# Patient Record
Sex: Female | Born: 1995 | Race: Black or African American | Hispanic: No | Marital: Single | Smoking: Current every day smoker
Health system: Southern US, Community
[De-identification: ages and names within clinical notes are randomized; demographics above are authoritative.]

## PROBLEM LIST (undated history)

## (undated) ENCOUNTER — Inpatient Hospital Stay (HOSPITAL_COMMUNITY): Payer: Self-pay

## (undated) DIAGNOSIS — Z789 Other specified health status: Secondary | ICD-10-CM

## (undated) HISTORY — PX: NO PAST SURGERIES: SHX2092

---
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
## 2016-01-01 ENCOUNTER — Encounter (HOSPITAL_COMMUNITY): Payer: Self-pay

## 2016-01-01 ENCOUNTER — Inpatient Hospital Stay (HOSPITAL_COMMUNITY)
Admission: AD | Admit: 2016-01-01 | Discharge: 2016-01-01 | Disposition: A | Discharge status: Home / Self Care | Payer: Medicaid Other | Source: Ambulatory Visit | Attending: Family Medicine | Admitting: Family Medicine

## 2016-01-01 DIAGNOSIS — Z825 Family history of asthma and other chronic lower respiratory diseases: Secondary | ICD-10-CM | POA: Diagnosis not present

## 2016-01-01 DIAGNOSIS — O30039 Twin pregnancy, monochorionic/diamniotic, unspecified trimester: Secondary | ICD-10-CM

## 2016-01-01 DIAGNOSIS — R112 Nausea with vomiting, unspecified: Secondary | ICD-10-CM | POA: Insufficient documentation

## 2016-01-01 DIAGNOSIS — O4693 Antepartum hemorrhage, unspecified, third trimester: Secondary | ICD-10-CM | POA: Diagnosis not present

## 2016-01-01 DIAGNOSIS — O30033 Twin pregnancy, monochorionic/diamniotic, third trimester: Secondary | ICD-10-CM | POA: Diagnosis not present

## 2016-01-01 DIAGNOSIS — Z833 Family history of diabetes mellitus: Secondary | ICD-10-CM | POA: Diagnosis not present

## 2016-01-01 DIAGNOSIS — O99013 Anemia complicating pregnancy, third trimester: Secondary | ICD-10-CM | POA: Diagnosis not present

## 2016-01-01 DIAGNOSIS — O9989 Other specified diseases and conditions complicating pregnancy, childbirth and the puerperium: Secondary | ICD-10-CM | POA: Insufficient documentation

## 2016-01-01 DIAGNOSIS — R51 Headache: Secondary | ICD-10-CM | POA: Insufficient documentation

## 2016-01-01 DIAGNOSIS — O219 Vomiting of pregnancy, unspecified: Secondary | ICD-10-CM | POA: Diagnosis not present

## 2016-01-01 DIAGNOSIS — Z3A34 34 weeks gestation of pregnancy: Secondary | ICD-10-CM | POA: Insufficient documentation

## 2016-01-01 DIAGNOSIS — R040 Epistaxis: Secondary | ICD-10-CM | POA: Insufficient documentation

## 2016-01-01 DIAGNOSIS — O4703 False labor before 37 completed weeks of gestation, third trimester: Secondary | ICD-10-CM

## 2016-01-01 DIAGNOSIS — N858 Other specified noninflammatory disorders of uterus: Secondary | ICD-10-CM | POA: Diagnosis present

## 2016-01-01 DIAGNOSIS — O4690 Antepartum hemorrhage, unspecified, unspecified trimester: Secondary | ICD-10-CM

## 2016-01-01 DIAGNOSIS — O479 False labor, unspecified: Secondary | ICD-10-CM

## 2016-01-01 LAB — COMPREHENSIVE METABOLIC PANEL
ALK PHOS: 240 U/L — AB (ref 38–126)
ALT: 15 U/L (ref 14–54)
AST: 27 U/L (ref 15–41)
Albumin: 2.6 g/dL — ABNORMAL LOW (ref 3.5–5.0)
Anion gap: 8 (ref 5–15)
BILIRUBIN TOTAL: 0.9 mg/dL (ref 0.3–1.2)
BUN: 7 mg/dL (ref 6–20)
CALCIUM: 8.8 mg/dL — AB (ref 8.9–10.3)
CO2: 21 mmol/L — AB (ref 22–32)
CREATININE: 0.53 mg/dL (ref 0.44–1.00)
Chloride: 106 mmol/L (ref 101–111)
Glucose, Bld: 96 mg/dL (ref 65–99)
Potassium: 3.6 mmol/L (ref 3.5–5.1)
Sodium: 135 mmol/L (ref 135–145)
TOTAL PROTEIN: 6.4 g/dL — AB (ref 6.5–8.1)

## 2016-01-01 LAB — CBC WITH DIFFERENTIAL/PLATELET
BASOS ABS: 0 10*3/uL (ref 0.0–0.1)
BASOS PCT: 0 %
Eosinophils Absolute: 0.1 10*3/uL (ref 0.0–0.7)
Eosinophils Relative: 1 %
HEMATOCRIT: 25 % — AB (ref 36.0–46.0)
HEMOGLOBIN: 8.1 g/dL — AB (ref 12.0–15.0)
LYMPHS PCT: 22 %
Lymphs Abs: 2 10*3/uL (ref 0.7–4.0)
MCH: 26.5 pg (ref 26.0–34.0)
MCHC: 32.4 g/dL (ref 30.0–36.0)
MCV: 81.7 fL (ref 78.0–100.0)
Monocytes Absolute: 0.8 10*3/uL (ref 0.1–1.0)
Monocytes Relative: 8 %
NEUTROS ABS: 6.2 10*3/uL (ref 1.7–7.7)
NEUTROS PCT: 69 %
Platelets: 246 10*3/uL (ref 150–400)
RBC: 3.06 MIL/uL — ABNORMAL LOW (ref 3.87–5.11)
RDW: 13.8 % (ref 11.5–15.5)
WBC: 9.1 10*3/uL (ref 4.0–10.5)

## 2016-01-01 LAB — PROTEIN / CREATININE RATIO, URINE
Creatinine, Urine: 94 mg/dL
PROTEIN CREATININE RATIO: 0.29 mg/mg{creat} — AB (ref 0.00–0.15)
TOTAL PROTEIN, URINE: 27 mg/dL

## 2016-01-01 LAB — URINE MICROSCOPIC-ADD ON: RBC / HPF: NONE SEEN RBC/hpf (ref 0–5)

## 2016-01-01 LAB — URINALYSIS, ROUTINE W REFLEX MICROSCOPIC
BILIRUBIN URINE: NEGATIVE
Glucose, UA: NEGATIVE mg/dL
KETONES UR: 15 mg/dL — AB
NITRITE: NEGATIVE
PH: 6.5 (ref 5.0–8.0)
PROTEIN: NEGATIVE mg/dL
Specific Gravity, Urine: <1.005 — ABNORMAL LOW (ref 1.005–1.030)

## 2016-01-01 MED ORDER — PROMETHAZINE HCL 25 MG PO TABS: 25.0000 mg | ORAL_TABLET | Freq: Four times a day (QID) | ORAL | Status: DC | PRN

## 2016-01-01 NOTE — Discharge Instructions (Signed)
Braxton Hicks Contractions Contractions of the uterus can occur throughout pregnancy. Contractions are not always a sign that you are in labor.  WHAT ARE BRAXTON HICKS CONTRACTIONS?  Contractions that occur before labor are called Braxton Hicks contractions, or false labor. Toward the end of pregnancy (32-34 weeks), these contractions can develop more often and may become more forceful. This is not true labor because these contractions do not result in opening (dilatation) and thinning of the cervix. They are sometimes difficult to tell apart from true labor because these contractions can be forceful and people have different pain tolerances. You should not feel embarrassed if you go to the hospital with false labor. Sometimes, the only way to tell if you are in true labor is for your health care provider to look for changes in the cervix. If there are no prenatal problems or other health problems associated with the pregnancy, it is completely safe to be sent home with false labor and await the onset of true labor. HOW CAN YOU TELL THE DIFFERENCE BETWEEN TRUE AND FALSE LABOR? False Labor  The contractions of false labor are usually shorter and not as hard as those of true labor.   The contractions are usually irregular.   The contractions are often felt in the front of the lower abdomen and in the groin.   The contractions may go away when you walk around or change positions while lying down.   The contractions get weaker and are shorter lasting as time goes on.   The contractions do not usually become progressively stronger, regular, and closer together as with true labor.  True Labor  Contractions in true labor last 30-70 seconds, become very regular, usually become more intense, and increase in frequency.   The contractions do not go away with walking.   The discomfort is usually felt in the top of the uterus and spreads to the lower abdomen and low back.   True labor can be  determined by your health care provider with an exam. This will show that the cervix is dilating and getting thinner.  WHAT TO REMEMBER  Keep up with your usual exercises and follow other instructions given by your health care provider.   Take medicines as directed by your health care provider.   Keep your regular prenatal appointments.   Eat and drink lightly if you think you are going into labor.   If Braxton Hicks contractions are making you uncomfortable:   Change your position from lying down or resting to walking, or from walking to resting.   Sit and rest in a tub of warm water.   Drink 2-3 glasses of water. Dehydration may cause these contractions.   Do slow and deep breathing several times an hour.  WHEN SHOULD I SEEK IMMEDIATE MEDICAL CARE? Seek immediate medical care if:  Your contractions become stronger, more regular, and closer together.   You have fluid leaking or gushing from your vagina.   You have a fever.   You pass blood-tinged mucus.   You have vaginal bleeding.   You have continuous abdominal pain.   You have low back pain that you never had before.   You feel your baby's head pushing down and causing pelvic pressure.   Your baby is not moving as much as it used to.    This information is not intended to replace advice given to you by your health care provider. Make sure you discuss any questions you have with your health care  provider.   Document Released: 10/28/2005 Document Revised: 11/02/2013 Document Reviewed: 08/09/2013 Elsevier Interactive Patient Education 2016 Elsevier Inc. Morning Sickness Morning sickness is when you feel sick to your stomach (nauseous) during pregnancy. This nauseous feeling may or may not come with vomiting. It often occurs in the morning but can be a problem any time of day. Morning sickness is most common during the first trimester, but it may continue throughout pregnancy. While morning sickness  is unpleasant, it is usually harmless unless you develop severe and continual vomiting (hyperemesis gravidarum). This condition requires more intense treatment.  CAUSES  The cause of morning sickness is not completely known but seems to be related to normal hormonal changes that occur in pregnancy. RISK FACTORS You are at greater risk if you:  Experienced nausea or vomiting before your pregnancy.  Had morning sickness during a previous pregnancy.  Are pregnant with more than one baby, such as twins. TREATMENT  Do not use any medicines (prescription, over-the-counter, or herbal) for morning sickness without first talking to your health care provider. Your health care provider may prescribe or recommend:  Vitamin B6 supplements.  Anti-nausea medicines.  The herbal medicine ginger. HOME CARE INSTRUCTIONS   Only take over-the-counter or prescription medicines as directed by your health care provider.  Taking multivitamins before getting pregnant can prevent or decrease the severity of morning sickness in most women.  Eat a piece of dry toast or unsalted crackers before getting out of bed in the morning.  Eat five or six small meals a day.  Eat dry and bland foods (rice, baked potato). Foods high in carbohydrates are often helpful.  Do not drink liquids with your meals. Drink liquids between meals.  Avoid greasy, fatty, and spicy foods.  Get someone to cook for you if the smell of any food causes nausea and vomiting.  If you feel nauseous after taking prenatal vitamins, take the vitamins at night or with a snack.  Snack on protein foods (nuts, yogurt, cheese) between meals if you are hungry.  Eat unsweetened gelatins for desserts.  Wearing an acupressure wristband (worn for sea sickness) may be helpful.  Acupuncture may be helpful.  Do not smoke.  Get a humidifier to keep the air in your house free of odors.  Get plenty of fresh air. SEEK MEDICAL CARE IF:   Your home  remedies are not working, and you need medicine.  You feel dizzy or lightheaded.  You are losing weight. SEEK IMMEDIATE MEDICAL CARE IF:   You have persistent and uncontrolled nausea and vomiting.  You pass out (faint). MAKE SURE YOU:  Understand these instructions.  Will watch your condition.  Will get help right away if you are not doing well or get worse.   This information is not intended to replace advice given to you by your health care provider. Make sure you discuss any questions you have with your health care provider.   Document Released: 12/19/2006 Document Revised: 11/02/2013 Document Reviewed: 04/14/2013 Elsevier Interactive Patient Education [employer]

## 2016-01-01 NOTE — MAU Note (Signed)
After reviewing lab work, per Dr Ashok Pall pt may be discharged home. Pt is to have follow up this week in clinic if she does not already have an appointment scheduled.

## 2016-01-01 NOTE — MAU Provider Note (Signed)
MAU HISTORY AND PHYSICAL  Chief Complaint:  contractions  Pamela [name] is a 20 y.o.  G1P0 with IUP at [redacted]w[redacted]d, mo-di twin pregnancy, presenting for contractions. Began earlier today. Unsure how often but "frequent." No leakage of fluid or bleeding. Has had intermittent nausea/vomiting throughout pregnancy, least earlier this morning. No abdominal pain outside contractions. No fever. No dysuria or hematuria. No vaginal discharge. Positive fetal movement.  Also complaining of headache earlier today, now resolved, as well as epistaxis earlier today, also resolved. No bleeding elsewhere. Has had several nosebleeds this pregnancy.  Past Medical History  Diagnosis Date  . Medical history non-contributory     Past Surgical History  Procedure Laterality Date  . No past surgeries      Family History  Problem Relation Age of Onset  . Diabetes Mother   . Asthma Sister     Social History  Substance Use Topics  . Smoking status: Never Smoker   . Smokeless tobacco: None  . Alcohol Use: No    No Known Allergies  No prescriptions prior to admission    Review of Systems - Negative except for what is mentioned in HPI.  Physical Exam  Blood pressure 137/78, pulse 82, temperature 98.4 F (36.9 C), temperature source Oral, resp. rate 18, height  (1.6 m), weight 143 lb (64.864 kg), last menstrual period 05/01/2015, SpO2 100 %. GENERAL: Well-developed, well-nourished female in no acute distress.  LUNGS: Clear to auscultation bilaterally.  HEART: Regular rate and rhythm. ABDOMEN: Soft, nontender, nondistended, gravid.  EXTREMITIES: Nontender, no edema, 2+ distal pulses. Cervical Exam: closed/thick/high FHT:  140/mod/+a/-d 145/mod/+a/-d    Labs: Results for orders placed or performed during the hospital encounter of 01/01/16 (from the past 24 hour(s))  Urinalysis, Routine w reflex microscopic (not at [facility])   Collection Time: 01/01/16  9:04 PM  Result Value Ref Range   Color,  Urine YELLOW YELLOW   APPearance CLEAR CLEAR   Specific Gravity, Urine <1.005 (L) 1.005 - 1.030   pH 6.5 5.0 - 8.0   Glucose, UA NEGATIVE NEGATIVE mg/dL   Hgb urine dipstick TRACE (A) NEGATIVE   Bilirubin Urine NEGATIVE NEGATIVE   Ketones, ur 15 (A) NEGATIVE mg/dL   Protein, ur NEGATIVE NEGATIVE mg/dL   Nitrite NEGATIVE NEGATIVE   Leukocytes, UA SMALL (A) NEGATIVE  Urine microscopic-add on   Collection Time: 01/01/16  9:04 PM  Result Value Ref Range   Squamous Epithelial / LPF 0-5 (A) NONE SEEN   WBC, UA 0-5 0 - 5 WBC/hpf   RBC / HPF NONE SEEN 0 - 5 RBC/hpf   Bacteria, UA FEW (A) NONE SEEN  Protein / creatinine ratio, urine   Collection Time: 01/01/16  9:04 PM  Result Value Ref Range   Creatinine, Urine 94.00 mg/dL   Total Protein, Urine 27 mg/dL   Protein Creatinine Ratio 0.29 (H) 0.00 - 0.15 mg/mg[Cre]  CBC with Differential/Platelet   Collection Time: 01/01/16 10:12 PM  Result Value Ref Range   WBC 9.1 4.0 - 10.5 K/uL   RBC 3.06 (L) 3.87 - 5.11 MIL/uL   Hemoglobin 8.1 (L) 12.0 - 15.0 g/dL   HCT 30.8 (L) 65.7 - 84.6 %   MCV 81.7 78.0 - 100.0 fL   MCH 26.5 26.0 - 34.0 pg   MCHC 32.4 30.0 - 36.0 g/dL   RDW 96.2 95.2 - 84.1 %   Platelets 246 150 - 400 K/uL   Neutrophils Relative % 69 %   Neutro Abs 6.2 1.7 -  7.7 K/uL   Lymphocytes Relative 22 %   Lymphs Abs 2.0 0.7 - 4.0 K/uL   Monocytes Relative 8 %   Monocytes Absolute 0.8 0.1 - 1.0 K/uL   Eosinophils Relative 1 %   Eosinophils Absolute 0.1 0.0 - 0.7 K/uL   Basophils Relative 0 %   Basophils Absolute 0.0 0.0 - 0.1 K/uL  Comprehensive metabolic panel   Collection Time: 01/01/16 10:12 PM  Result Value Ref Range   Sodium 135 135 - 145 mmol/L   Potassium 3.6 3.5 - 5.1 mmol/L   Chloride 106 101 - 111 mmol/L   CO2 21 (L) 22 - 32 mmol/L   Glucose, Bld 96 65 - 99 mg/dL   BUN 7 6 - 20 mg/dL   Creatinine, Ser 1.61 0.44 - 1.00 mg/dL   Calcium 8.8 (L) 8.9 - 10.3 mg/dL   Total Protein 6.4 (L) 6.5 - 8.1 g/dL   Albumin 2.6  (L) 3.5 - 5.0 g/dL   AST 27 15 - 41 U/L   ALT 15 14 - 54 U/L   Alkaline Phosphatase 240 (H) 38 - 126 U/L   Total Bilirubin 0.9 0.3 - 1.2 mg/dL   GFR calc non Af Amer >60 >60 mL/min   GFR calc Af Amer >60 >60 mL/min   Anion gap 8 5 - 15    Imaging Studies:  [place] Mfm Fetal Bpp Wo Non Stress  12/18/2015  OBSTETRICAL ULTRASOUND: This exam was performed within a [place] [facility] Department. The OB US report was generated in the AS system, and faxed to the ordering physician.  This report is available in the [employer]. See the AS Obstetric US report via the Image Link.  [place] Mfm Ob Follow Up  12/18/2015  OBSTETRICAL ULTRASOUND: This exam was performed within a [place] [facility] Department. The OB US report was generated in the AS system, and faxed to the ordering physician.  This report is available in the [employer]. See the AS Obstetric US report via the Image Link.  [place] Mfm Ob Follow Up Addl Gest  12/18/2015  OBSTETRICAL ULTRASOUND: This exam was performed within a [place] [facility] Department. The OB US report was generated in the AS system, and faxed to the ordering physician.  This report is available in the [employer]. See the AS Obstetric US report via the Image Link.  [place] Mfm Ob Limited  12/13/2015  OBSTETRICAL ULTRASOUND: This exam was performed within a [place] [facility] Department. The OB US report was generated in the AS system, and faxed to the ordering physician.  This report is available in the [employer]. See the AS Obstetric US report via the Image Link.  [place] Mfm Ob Limited  12/07/2015  OBSTETRICAL ULTRASOUND: This exam was performed within a [place] [facility] Department. The OB US report was generated in the AS system, and faxed to the ordering physician.  This report is available in the [employer]. See the AS Obstetric US report via the Image Link.  [place] Mfm Fetal Bpp Wo Nst Addl Gestation  12/18/2015  OBSTETRICAL ULTRASOUND: This exam was performed within a  [place] [facility]trasound Department. The OB US report was generated in the AS system, and faxed to the ordering physician.  This report is available in the [employer]. See the AS Obstetric US report via the Image Link.   Assessment/Plan: Pamela [name] is  20 y.o. G1P0 at [redacted]w[redacted]d presents with various complaints.  #Epistaxis - no current bleeding - reassured  this normal during pregnancy - vaseline daily to both nares  #Braxton hicks contractions - NST reactive - cervix closed/thick/high - some degree of dehydration from n/v may contribute - ptl, abruption, and pprom return precautions  #Nausea/vomiting - has suffered from this throughout pregnancy. Says does not have prn meds at home.  - here well-appearing, does not appear dehydrated. No abdominal pain to suggest intraabdominal pathology - phenergan script - abdominal pain and dehhydration return precautions; push fluids  #Headache - initial BP mildly elevated, remainder all wnl. HA mostly resolved, no other preE symptoms - preE labs obtained, showing UPC of 0.29 and remainder wnl.  - consider repeat UPC vs 24-hour collection at w/u  #Anemia - this noted after patient's discharge - ferritin added-on to confirm iron deficiency - start oral iron - will have nursing contact patient for clinic visit this week to consider [facility] 2/21/201712:10 AM

## 2016-01-01 NOTE — MAU Note (Signed)
Pt presents with complaint of contractions all day, worsening and getting closer. Has had nosebleeds all day. Also reports she may have had a fever earlier today. Also reports a headache and vomiting today.

## 2016-01-02 LAB — FERRITIN: Ferritin: 11 ng/mL (ref 11–307)

## 2016-01-02 MED ORDER — FERROUS SULFATE 325 (65 FE) MG PO TABS: 325.0000 mg | ORAL_TABLET | Freq: Two times a day (BID) | ORAL | Status: AC

## 2016-01-03 LAB — CULTURE, OB URINE

## 2016-01-04 ENCOUNTER — Ambulatory Visit (INDEPENDENT_AMBULATORY_CARE_PROVIDER_SITE_OTHER): Payer: Medicaid Other | Admitting: Family

## 2016-01-04 VITALS — BP 140/85 | HR 88 | Temp 98.4°F | Wt 147.4 lb

## 2016-01-04 DIAGNOSIS — O163 Unspecified maternal hypertension, third trimester: Secondary | ICD-10-CM

## 2016-01-04 DIAGNOSIS — O99013 Anemia complicating pregnancy, third trimester: Secondary | ICD-10-CM | POA: Diagnosis not present

## 2016-01-04 DIAGNOSIS — O1493 Unspecified pre-eclampsia, third trimester: Secondary | ICD-10-CM

## 2016-01-04 DIAGNOSIS — O133 Gestational [pregnancy-induced] hypertension without significant proteinuria, third trimester: Secondary | ICD-10-CM | POA: Diagnosis present

## 2016-01-04 DIAGNOSIS — D649 Anemia, unspecified: Secondary | ICD-10-CM

## 2016-01-04 DIAGNOSIS — O30039 Twin pregnancy, monochorionic/diamniotic, unspecified trimester: Secondary | ICD-10-CM

## 2016-01-04 DIAGNOSIS — O30033 Twin pregnancy, monochorionic/diamniotic, third trimester: Secondary | ICD-10-CM | POA: Diagnosis not present

## 2016-01-04 DIAGNOSIS — O165 Unspecified maternal hypertension, complicating the puerperium: Secondary | ICD-10-CM | POA: Insufficient documentation

## 2016-01-04 DIAGNOSIS — O0993 Supervision of high risk pregnancy, unspecified, third trimester: Secondary | ICD-10-CM

## 2016-01-04 LAB — CBC
HCT: 24.6 % — ABNORMAL LOW (ref 36.0–46.0)
Hemoglobin: 8 g/dL — ABNORMAL LOW (ref 12.0–15.0)
MCH: 26.1 pg (ref 26.0–34.0)
MCHC: 32.5 g/dL (ref 30.0–36.0)
MCV: 80.4 fL (ref 78.0–100.0)
MPV: 9.8 fL (ref 8.6–12.4)
PLATELETS: 236 10*3/uL (ref 150–400)
RBC: 3.06 MIL/uL — ABNORMAL LOW (ref 3.87–5.11)
RDW: 14.1 % (ref 11.5–15.5)
WBC: 6.4 10*3/uL (ref 4.0–10.5)

## 2016-01-04 LAB — COMPREHENSIVE METABOLIC PANEL
ALBUMIN: 2.9 g/dL — AB (ref 3.6–5.1)
ALK PHOS: 242 U/L — AB (ref 47–176)
ALT: 12 U/L (ref 5–32)
AST: 15 U/L (ref 12–32)
BILIRUBIN TOTAL: 0.5 mg/dL (ref 0.2–1.1)
BUN: 7 mg/dL (ref 7–20)
CO2: 21 mmol/L (ref 20–31)
CREATININE: 0.55 mg/dL (ref 0.50–1.00)
Calcium: 8.8 mg/dL — ABNORMAL LOW (ref 8.9–10.4)
Chloride: 105 mmol/L (ref 98–110)
GLUCOSE: 93 mg/dL (ref 65–99)
Potassium: 4.2 mmol/L (ref 3.8–5.1)
SODIUM: 136 mmol/L (ref 135–146)
Total Protein: 5.7 g/dL — ABNORMAL LOW (ref 6.3–8.2)

## 2016-01-04 LAB — POCT URINALYSIS DIP (DEVICE)
Bilirubin Urine: NEGATIVE
GLUCOSE, UA: NEGATIVE mg/dL
Hgb urine dipstick: NEGATIVE
KETONES UR: NEGATIVE mg/dL
Nitrite: NEGATIVE
Protein, ur: NEGATIVE mg/dL
Specific Gravity, Urine: 1.015 (ref 1.005–1.030)
Urobilinogen, UA: 0.2 mg/dL (ref 0.0–1.0)
pH: 6.5 (ref 5.0–8.0)

## 2016-01-04 NOTE — Progress Notes (Signed)
Subjective:  Pamela [name] is a 20 y.o. G1P0 at 73w5dbeing seen today for ongoing prenatal care.  She is currently monitored for the following issues for this high-risk pregnancy and has Monochorionic diamniotic twin pregnancy, antepartum; Susceptible to varicella (non-immune), currently pregnant; High-risk pregnancy supervision; Vaginal bleeding during pregnancy, antepartum; Pica; and Elevated blood pressure complicating pregnancy, antepartum on her problem list.  Patient reports being seen in [facility]U on 2/20 for contractions.  At visit reports blood pressure was elevated, however can not find it documented.  Denies headach, vision changes, or epigastric pain today.  Contractions: Irregular. Vag. Bleeding: None.  Movement: Present. Denies leaking of fluid.   The following portions of the patient's history were reviewed and updated as appropriate: allergies, current medications, past family history, past medical history, past social history, past surgical history and problem list. Problem list updated.  Objective:   Filed Vitals:   01/04/16 0932  BP: 140/85  Pulse: 88  Temp: 98.4 F (36.9 C)  Weight: 147 lb 6.4 oz (66.86 kg)    Fetal Status: Fetal Heart Rate (bpm): 131/159 Fundal Height: 39 cm Movement: Present     General:  Alert, oriented and cooperative. Patient is in no acute distress.  Skin: Skin is warm and dry. No rash noted.   Cardiovascular: Normal heart rate noted  Respiratory: Normal respiratory effort, no problems with respiration noted  Abdomen: Soft, gravid, appropriate for gestational age. Pain/Pressure: Present     Pelvic: Vag. Bleeding: None     Cervical exam deferred        Extremities: Normal range of motion.  Edema: None  Mental Status: Normal mood and affect. Normal behavior. Normal judgment and thought content.   Urinalysis: Urine Protein: Negative Urine Glucose: Negative  Assessment and Plan:  Pregnancy: G1P0 at 366w5d1. High-risk pregnancy supervision,  third trimester - Continue close observation  2. Monochorionic diamniotic twin pregnancy, antepartum - NST today; begin 2x/week fetal testing - Next growth ultrasound on 01/08/16  3. Elevated blood pressure complicating pregnancy, antepartum, third trimester - Protein / creatinine ratio, urine - Comp Met (CMET) - CBC  Preterm labor symptoms and general obstetric precautions including but not limited to vaginal bleeding, contractions, leaking of fluid and fetal movement were reviewed in detail with the patient. Please refer to After Visit Summary for other counseling recommendations.  Return for NST 2/wk and appt weekly.   WaVenia Carbon Michiel CowboyCNM

## 2016-01-04 NOTE — Patient Instructions (Signed)
AREA PEDIATRIC/FAMILY PRACTICE PHYSICIANS  [facility] [address] [place], [place] [address] Phone - [phone]   Fax - [phone]  [facility] [address] [place], [place]  [address] Phone - [phone]   Fax - [phone]  [facility] [address] [place], [place]  [address] Phone - [phone]   Fax - [phone]  [facility] [address] [place], [place]  [address] Phone - [phone]   Fax - [phone]  [facility] [address], [address] [place], [place]  [address] Phone - [phone]   Fax - [phone]  [facility] [address] [place], [place]  [address] Phone - [phone]   Fax - [phone]  [facility] [address] [place], [place]  [address] Phone - [phone]   Fax - [phone]  [facility] [address], [address] [place], [place]  [address] Phone - [phone]   Fax - [phone]  [facility] [address] [place], [place]  [address] Phone - [phone]   Fax - [phone] [facility] [address] [place], [place]  [address] Phone - [phone]   Fax - [phone]  [facility] [address] [place], [place]  [address] Phone - [phone]   Fax - [phone]  [facility] [address] [place], [place]  [address] Phone - [phone]   Fax - [phone]  [facility] [address] [place], [place]  [address] Phone - [phone]   Fax - [phone]  SHILPA GOSRANI [address] [place], [place]  [address] Phone - [phone]  [facility] [address] [place], [place]  [address] Phone - [phone]   Fax - [phone]  [facility] [address] [place], [place]  [address] Phone - [phone]   Fax - [phone]  [facility] [address] [place], [place]  [address] Phone - [phone]   Fax - [phone]  [facility] [address] [place], [place]  [address] Phone - [phone]   Fax - [phone]   [facility] [address] [place], [place]  [address] Phone - [phone]   Fax - [phone]  [facility] [address] [place], [place]  [address] Phone - [phone]   Fax - [phone]  DAV[facility] RUB[facility] [address] [place], [place]  [address] Phone - [phone]   Fax - [phone]  [facility] [address] [place], [place]  [address] Phone - [phone]   Fax - [phone]  [facility] [address] [place], [place]  [address] Phone - [phone]   Fax - [phone] [facility] [address] [place], [place]  [address] Phone - [phone]   Fax - [phone]  [facility] [address] [place], [place]  [address] Phone - [phone]   Fax - [phone]  [facility] [address] [place],   [address] Phone - [phone]   Fax - [phone]   

## 2016-01-04 NOTE — Addendum Note (Signed)
Addended by: Jill Side on: 01/04/2016 11:42 AM   Modules accepted: Orders

## 2016-01-05 DIAGNOSIS — O99019 Anemia complicating pregnancy, unspecified trimester: Secondary | ICD-10-CM | POA: Insufficient documentation

## 2016-01-05 LAB — PROTEIN / CREATININE RATIO, URINE
Creatinine, Urine: 53 mg/dL (ref 20–320)
Protein Creatinine Ratio: 302 mg/g creat — ABNORMAL HIGH (ref 21–161)
Total Protein, Urine: 16 mg/dL (ref 5–24)

## 2016-01-05 NOTE — Progress Notes (Signed)
01/05/16  Results from prex labs returned.  PR:CR

## 2016-01-08 ENCOUNTER — Encounter (HOSPITAL_COMMUNITY): Payer: Self-pay

## 2016-01-08 ENCOUNTER — Ambulatory Visit (HOSPITAL_COMMUNITY)
Admission: RE | Admit: 2016-01-08 | Discharge: 2016-01-08 | Disposition: A | Discharge status: Home / Self Care | Payer: Medicaid Other | Source: Ambulatory Visit | Attending: Maternal and Fetal Medicine | Admitting: Maternal and Fetal Medicine

## 2016-01-08 DIAGNOSIS — O30033 Twin pregnancy, monochorionic/diamniotic, third trimester: Secondary | ICD-10-CM | POA: Diagnosis not present

## 2016-01-08 DIAGNOSIS — Z3A35 35 weeks gestation of pregnancy: Secondary | ICD-10-CM | POA: Diagnosis not present

## 2016-01-08 DIAGNOSIS — O133 Gestational [pregnancy-induced] hypertension without significant proteinuria, third trimester: Secondary | ICD-10-CM | POA: Insufficient documentation

## 2016-01-08 DIAGNOSIS — O30039 Twin pregnancy, monochorionic/diamniotic, unspecified trimester: Secondary | ICD-10-CM

## 2016-01-08 DIAGNOSIS — O163 Unspecified maternal hypertension, third trimester: Secondary | ICD-10-CM

## 2016-01-11 ENCOUNTER — Other Ambulatory Visit (HOSPITAL_COMMUNITY)
Admission: RE | Admit: 2016-01-11 | Discharge: 2016-01-11 | Disposition: A | Discharge status: Home / Self Care | Payer: Medicaid Other | Source: Ambulatory Visit | Attending: Obstetrics & Gynecology | Admitting: Obstetrics & Gynecology

## 2016-01-11 ENCOUNTER — Telehealth (HOSPITAL_COMMUNITY): Payer: Self-pay | Admitting: *Deleted

## 2016-01-11 ENCOUNTER — Ambulatory Visit (INDEPENDENT_AMBULATORY_CARE_PROVIDER_SITE_OTHER): Payer: Medicaid Other | Admitting: Obstetrics & Gynecology

## 2016-01-11 VITALS — BP 153/82 | HR 97 | Temp 98.5°F | Wt 152.9 lb

## 2016-01-11 DIAGNOSIS — O30033 Twin pregnancy, monochorionic/diamniotic, third trimester: Secondary | ICD-10-CM | POA: Diagnosis present

## 2016-01-11 DIAGNOSIS — O0993 Supervision of high risk pregnancy, unspecified, third trimester: Secondary | ICD-10-CM

## 2016-01-11 DIAGNOSIS — Z113 Encounter for screening for infections with a predominantly sexual mode of transmission: Secondary | ICD-10-CM | POA: Insufficient documentation

## 2016-01-11 DIAGNOSIS — O1493 Unspecified pre-eclampsia, third trimester: Secondary | ICD-10-CM

## 2016-01-11 DIAGNOSIS — O30039 Twin pregnancy, monochorionic/diamniotic, unspecified trimester: Secondary | ICD-10-CM

## 2016-01-11 LAB — COMPREHENSIVE METABOLIC PANEL
ALBUMIN: 2.8 g/dL — AB (ref 3.6–5.1)
ALT: 11 U/L (ref 5–32)
AST: 17 U/L (ref 12–32)
Alkaline Phosphatase: 264 U/L — ABNORMAL HIGH (ref 47–176)
BUN: 7 mg/dL (ref 7–20)
CALCIUM: 9 mg/dL (ref 8.9–10.4)
CHLORIDE: 106 mmol/L (ref 98–110)
CO2: 21 mmol/L (ref 20–31)
CREATININE: 0.5 mg/dL (ref 0.50–1.00)
Glucose, Bld: 79 mg/dL (ref 65–99)
POTASSIUM: 4.2 mmol/L (ref 3.8–5.1)
SODIUM: 134 mmol/L — AB (ref 135–146)
TOTAL PROTEIN: 5.7 g/dL — AB (ref 6.3–8.2)
Total Bilirubin: 0.6 mg/dL (ref 0.2–1.1)

## 2016-01-11 LAB — CBC
HEMATOCRIT: 23.7 % — AB (ref 36.0–46.0)
Hemoglobin: 7.6 g/dL — ABNORMAL LOW (ref 12.0–15.0)
MCH: 25.5 pg — ABNORMAL LOW (ref 26.0–34.0)
MCHC: 32.1 g/dL (ref 30.0–36.0)
MCV: 79.5 fL (ref 78.0–100.0)
MPV: 10.6 fL (ref 8.6–12.4)
Platelets: 259 10*3/uL (ref 150–400)
RBC: 2.98 MIL/uL — AB (ref 3.87–5.11)
RDW: 14.3 % (ref 11.5–15.5)
WBC: 6.2 10*3/uL (ref 4.0–10.5)

## 2016-01-11 LAB — POCT URINALYSIS DIP (DEVICE)
BILIRUBIN URINE: NEGATIVE
Glucose, UA: NEGATIVE mg/dL
KETONES UR: NEGATIVE mg/dL
Nitrite: NEGATIVE
PH: 6 (ref 5.0–8.0)
Protein, ur: 30 mg/dL — AB
SPECIFIC GRAVITY, URINE: 1.01 (ref 1.005–1.030)
Urobilinogen, UA: 0.2 mg/dL (ref 0.0–1.0)

## 2016-01-11 LAB — OB RESULTS CONSOLE GC/CHLAMYDIA: Gonorrhea: NEGATIVE

## 2016-01-11 NOTE — Progress Notes (Signed)
Subjective:  Pamela [name] is a 20 y.o. G1P0 at [redacted]w[redacted]d being seen today for ongoing prenatal care.  She is currently monitored for the following issues for this high-risk pregnancy and has Monochorionic diamniotic twin pregnancy, antepartum; Susceptible to varicella (non-immune), currently pregnant; High-risk pregnancy supervision; Vaginal bleeding during pregnancy, antepartum; Pica; Hypertension in pregnancy, preeclampsia; and Anemia of mother in pregnancy, antepartum on her problem list.  Patient reports no complaints.  Had mild headaches alleviated with Tylenol, no current headaches, visual symptoms or RUQ/epigastric pain.  Contractions: Irregular. Vag. Bleeding: None.  Movement: Present. Denies leaking of fluid.   The following portions of the patient's history were reviewed and updated as appropriate: allergies, current medications, past family history, past medical history, past social history, past surgical history and problem list. Problem list updated.  Objective:   Filed Vitals:   01/11/16 1108  BP: 153/82  Pulse: 97  Temp: 98.5 F (36.9 C)  Weight: 152 lb 14.4 oz (69.355 kg)    Fetal Status: Fetal Heart Rate (bpm): 140/135 Fundal Height: 40 cm Movement: Present  Presentation: Vertex  General:  Alert, oriented and cooperative. Patient is in no acute distress.  Skin: Skin is warm and dry. No rash noted.   Cardiovascular: Normal heart rate noted  Respiratory: Normal respiratory effort, no problems with respiration noted  Abdomen: Soft, gravid, appropriate for gestational age. Pain/Pressure: Present     Pelvic: Vag. Bleeding: None    Cervical exam performed Dilation: 1 Effacement (%): 70 Station: -3 Pelvic cultures done  Extremities: Normal range of motion.  Edema: Mild pitting, slight indentation  Mental Status: Normal mood and affect. Normal behavior. Normal judgment and thought content.   Urinalysis: Urine Protein: 1+ Urine Glucose: Negative NST performed today was reviewed  and was found to be reactive x 2.    Assessment and Plan:  Pregnancy: G1P0 at [redacted]w[redacted]d  1. Hypertension in pregnancy, preeclampsia, third trimester Delivery indicated at 37 weeks (IOL scheduled 01/20/16 at 7 am), no need for delivery at 36 weeks (had phone discussion with Dr. Claudean Severance regarding this written recommendation of the 2/28 growth scan).  Will deliver earlier for any severe preeclampsia features.  Will recheck labs today.  Severe preeclampsia precautions reviewed with patient. - Protein / creatinine ratio, urine - Comprehensive metabolic panel - CBC  2. Monochorionic diamniotic twin pregnancy, antepartum Concordant growths on 01/09/16 scan.  Delivery indicated at 37 weeks.   Continue recommended antenatal testing and prenatal care.   3. High-risk pregnancy supervision, third trimester - GC/Chlamydia probe amp ([place])not at [facility] - Culture, beta strep (group b only) Preterm labor symptoms and general obstetric precautions including but not limited to vaginal bleeding, contractions, leaking of fluid and fetal movement were reviewed in detail with the patient. Please refer to After Visit Summary for other counseling recommendations.  Return for recommended antenatal testing and prenatal care.Tereso Newcomer, MD

## 2016-01-11 NOTE — Patient Instructions (Signed)
Return to clinic for any obstetric concerns or go to MAU for evaluation  

## 2016-01-11 NOTE — Telephone Encounter (Signed)
Preadmission screen  

## 2016-01-12 ENCOUNTER — Telehealth: Payer: Self-pay | Admitting: Obstetrics and Gynecology

## 2016-01-12 LAB — GC/CHLAMYDIA PROBE AMP (~~LOC~~) NOT AT ARMC
CHLAMYDIA, DNA PROBE: NEGATIVE
Neisseria Gonorrhea: NEGATIVE

## 2016-01-12 LAB — PROTEIN / CREATININE RATIO, URINE
Creatinine, Urine: 69 mg/dL (ref 20–320)
PROTEIN CREATININE RATIO: 739 mg/g{creat} — AB (ref 21–161)
TOTAL PROTEIN, URINE: 51 mg/dL — AB (ref 5–24)

## 2016-01-12 NOTE — Telephone Encounter (Signed)
Called pt and left message stating that I am returning her call from yesterday to see if she had a question or problem requiring medical advice. Please call back and leave a message with her question and state whether a detailed message can be left on her voice mail. The office closes @ noon today.

## 2016-01-12 NOTE — Telephone Encounter (Signed)
Received a call on 01/11/2016, but wasn't able to answer the phone due to phone messing up. Don't know what they were calling about.

## 2016-01-13 LAB — CULTURE, BETA STREP (GROUP B ONLY)

## 2016-01-15 ENCOUNTER — Ambulatory Visit (INDEPENDENT_AMBULATORY_CARE_PROVIDER_SITE_OTHER): Payer: Medicaid Other | Admitting: *Deleted

## 2016-01-15 VITALS — BP 139/83 | HR 87

## 2016-01-15 DIAGNOSIS — O30039 Twin pregnancy, monochorionic/diamniotic, unspecified trimester: Secondary | ICD-10-CM

## 2016-01-15 DIAGNOSIS — O30033 Twin pregnancy, monochorionic/diamniotic, third trimester: Secondary | ICD-10-CM | POA: Diagnosis not present

## 2016-01-15 NOTE — Addendum Note (Signed)
Addended by: Jill SideAY, Jonah Nestle L on: 01/15/2016 02:45 PM   Modules accepted: Orders

## 2016-01-15 NOTE — Addendum Note (Signed)
Addended by: Jill SideAY, DIANE L on: 01/15/2016 02:47 PM   Modules accepted: Orders

## 2016-01-15 NOTE — Progress Notes (Signed)
NST reactive for both babies

## 2016-01-18 ENCOUNTER — Ambulatory Visit (INDEPENDENT_AMBULATORY_CARE_PROVIDER_SITE_OTHER): Payer: Medicaid Other | Admitting: Obstetrics & Gynecology

## 2016-01-18 VITALS — BP 139/80 | HR 81 | Wt 151.2 lb

## 2016-01-18 DIAGNOSIS — O1493 Unspecified pre-eclampsia, third trimester: Secondary | ICD-10-CM | POA: Diagnosis not present

## 2016-01-18 DIAGNOSIS — D649 Anemia, unspecified: Secondary | ICD-10-CM

## 2016-01-18 DIAGNOSIS — O99013 Anemia complicating pregnancy, third trimester: Secondary | ICD-10-CM | POA: Diagnosis not present

## 2016-01-18 DIAGNOSIS — O30033 Twin pregnancy, monochorionic/diamniotic, third trimester: Secondary | ICD-10-CM

## 2016-01-18 DIAGNOSIS — O30039 Twin pregnancy, monochorionic/diamniotic, unspecified trimester: Secondary | ICD-10-CM

## 2016-01-18 LAB — POCT URINALYSIS DIP (DEVICE)
BILIRUBIN URINE: NEGATIVE
GLUCOSE, UA: NEGATIVE mg/dL
KETONES UR: NEGATIVE mg/dL
Nitrite: NEGATIVE
Protein, ur: 100 mg/dL — AB
SPECIFIC GRAVITY, URINE: 1.01 (ref 1.005–1.030)
UROBILINOGEN UA: 0.2 mg/dL (ref 0.0–1.0)
pH: 6.5 (ref 5.0–8.0)

## 2016-01-18 NOTE — Progress Notes (Signed)
IOL scheduled 3/11.

## 2016-01-18 NOTE — Patient Instructions (Signed)
Labor Induction Labor induction is when steps are taken to cause a pregnant woman to begin the labor process. Most women go into labor on their own between 37 weeks and 42 weeks of the pregnancy. When this does not happen or when there is a medical need, methods may be used to induce labor. Labor induction causes a pregnant woman's uterus to contract. It also causes the cervix to soften (ripen), open (dilate), and thin out (efface). Usually, labor is not induced before 39 weeks of the pregnancy unless there is a problem with the baby or mother.  Before inducing labor, your health care provider will consider a number of factors, including the following:  The medical condition of you and the baby.   How many weeks along you are.   The status of the baby's lung maturity.   The condition of the cervix.   The position of the baby.  WHAT ARE THE REASONS FOR LABOR INDUCTION? Labor may be induced for the following reasons:  The health of the baby or mother is at risk.   The pregnancy is overdue by 1 week or more.   The water breaks but labor does not start on its own.   The mother has a health condition or serious illness, such as high blood pressure, infection, placental abruption, or diabetes.  The amniotic fluid amounts are low around the baby.   The baby is distressed.  Convenience or wanting the baby to be born on a certain date is not a reason for inducing labor. WHAT METHODS ARE USED FOR LABOR INDUCTION? Several methods of labor induction may be used, such as:   Prostaglandin medicine. This medicine causes the cervix to dilate and ripen. The medicine will also start contractions. It can be taken by mouth or by inserting a suppository into the vagina.   Inserting a thin tube (catheter) with a balloon on the end into the vagina to dilate the cervix. Once inserted, the balloon is expanded with water, which causes the cervix to open.   Stripping the membranes. Your health  care provider separates amniotic sac tissue from the cervix, causing the cervix to be stretched and causing the release of a hormone called progesterone. This may cause the uterus to contract. It is often done during an office visit. You will be sent home to wait for the contractions to begin. You will then come in for an induction.   Breaking the water. Your health care provider makes a hole in the amniotic sac using a small instrument. Once the amniotic sac breaks, contractions should begin. This may still take hours to see an effect.   Medicine to trigger or strengthen contractions. This medicine is given through an IV access tube inserted into a vein in your arm.  All of the methods of induction, besides stripping the membranes, will be done in the hospital. Induction is done in the hospital so that you and the baby can be carefully monitored.  HOW LONG DOES IT TAKE FOR LABOR TO BE INDUCED? Some inductions can take up to 2-3 days. Depending on the cervix, it usually takes less time. It takes longer when you are induced early in the pregnancy or if this is your first pregnancy. If a mother is still pregnant and the induction has been going on for 2-3 days, either the mother will be sent home or a cesarean delivery will be needed. WHAT ARE THE RISKS ASSOCIATED WITH LABOR INDUCTION? Some of the risks of induction include:     Changes in fetal heart rate, such as too high, too low, or erratic.   Fetal distress.   Chance of infection for the mother and baby.   Increased chance of having a cesarean delivery.   Breaking off (abruption) of the placenta from the uterus (rare).   Uterine rupture (very rare).  When induction is needed for medical reasons, the benefits of induction may outweigh the risks. WHAT ARE SOME REASONS FOR NOT INDUCING LABOR? Labor induction should not be done if:   It is shown that your baby does not tolerate labor.   You have had previous surgeries on your  uterus, such as a myomectomy or the removal of fibroids.   Your placenta lies very low in the uterus and blocks the opening of the cervix (placenta previa).   Your baby is not in a head-down position.   The umbilical cord drops down into the birth canal in front of the baby. This could cut off the baby's blood and oxygen supply.   You have had a previous cesarean delivery.   There are unusual circumstances, such as the baby being extremely premature.    This information is not intended to replace advice given to you by your health care provider. Make sure you discuss any questions you have with your health care provider.   Document Released: 03/19/2007 Document Revised: 11/18/2014 Document Reviewed: 05/27/2013 Elsevier Interactive Patient Education 2016 Elsevier Inc.  

## 2016-01-18 NOTE — Progress Notes (Signed)
Subjective: IOL 3/11  Pamela [name] is a 20 y.o. G1P0 at 8415w5d being seen today for ongoing prenatal care.  She is currently monitored for the following issues for this high-risk pregnancy and has Monochorionic diamniotic twin pregnancy, antepartum; Susceptible to varicella (non-immune), currently pregnant; High-risk pregnancy supervision; Vaginal bleeding during pregnancy, antepartum; Pica; Hypertension in pregnancy, preeclampsia; and Anemia of mother in pregnancy, antepartum on her problem list.  Patient reports occasional contractions.  Contractions: Irregular. Vag. Bleeding: None.  Movement: Present. Denies leaking of fluid.   The following portions of the patient's history were reviewed and updated as appropriate: allergies, current medications, past family history, past medical history, past social history, past surgical history and problem list. Problem list updated.  Objective:   Filed Vitals:   01/18/16 1102  BP: 139/80  Pulse: 81  Weight: 151 lb 3.2 oz (68.584 kg)    Fetal Status: Fetal Heart Rate (bpm): NST   Movement: Present     General:  Alert, oriented and cooperative. Patient is in no acute distress.  Skin: Skin is warm and dry. No rash noted.   Cardiovascular: Normal heart rate noted  Respiratory: Normal respiratory effort, no problems with respiration noted  Abdomen: Soft, gravid, appropriate for gestational age. Pain/Pressure: Present     Pelvic: Vag. Bleeding: None     Cervical exam deferred        Extremities: Normal range of motion.     Mental Status: Normal mood and affect. Normal behavior. Normal judgment and thought content.   Urinalysis: Urine Protein: Negative Urine Glucose: Negative  Assessment and Plan:  Pregnancy: G1P0 at 3615w5d  1. Hypertension in pregnancy, preeclampsia, third trimester Reactive NST - Fetal nonstress test  2. Monochorionic diamniotic twin pregnancy, antepartum Doing well - Fetal nonstress test Concordant growth Preterm labor  symptoms and general obstetric precautions including but not limited to vaginal bleeding, contractions, leaking of fluid and fetal movement were reviewed in detail with the patient. Please refer to After Visit Summary for other counseling recommendations.  Return in about 6 weeks (around 02/29/2016), or if symptoms worsen or fail to improve, for PP visit  IOL on 3/11.   Adam PhenixJames G Athanasios Heldman, MD

## 2016-01-20 ENCOUNTER — Inpatient Hospital Stay (HOSPITAL_COMMUNITY): Payer: Medicaid Other | Admitting: Anesthesiology

## 2016-01-20 ENCOUNTER — Encounter (HOSPITAL_COMMUNITY): Payer: Self-pay

## 2016-01-20 ENCOUNTER — Inpatient Hospital Stay (HOSPITAL_COMMUNITY)
Admission: RE | Admit: 2016-01-20 | Payer: Medicaid Other | Source: Ambulatory Visit | Attending: Obstetrics & Gynecology | Admitting: Obstetrics & Gynecology

## 2016-01-20 ENCOUNTER — Inpatient Hospital Stay (HOSPITAL_COMMUNITY)
Admit: 2016-01-20 | Discharge: 2016-01-22 | DRG: 774 | Disposition: A | Discharge status: Home / Self Care | Payer: Medicaid Other | Source: Ambulatory Visit | Attending: Obstetrics & Gynecology | Admitting: Obstetrics & Gynecology

## 2016-01-20 DIAGNOSIS — O1404 Mild to moderate pre-eclampsia, complicating childbirth: Principal | ICD-10-CM | POA: Diagnosis present

## 2016-01-20 DIAGNOSIS — Z825 Family history of asthma and other chronic lower respiratory diseases: Secondary | ICD-10-CM | POA: Diagnosis not present

## 2016-01-20 DIAGNOSIS — D649 Anemia, unspecified: Secondary | ICD-10-CM | POA: Diagnosis present

## 2016-01-20 DIAGNOSIS — Z833 Family history of diabetes mellitus: Secondary | ICD-10-CM

## 2016-01-20 DIAGNOSIS — O165 Unspecified maternal hypertension, complicating the puerperium: Secondary | ICD-10-CM | POA: Diagnosis present

## 2016-01-20 DIAGNOSIS — O9962 Diseases of the digestive system complicating childbirth: Secondary | ICD-10-CM | POA: Diagnosis present

## 2016-01-20 DIAGNOSIS — Z283 Underimmunization status: Secondary | ICD-10-CM

## 2016-01-20 DIAGNOSIS — O99019 Anemia complicating pregnancy, unspecified trimester: Secondary | ICD-10-CM | POA: Diagnosis present

## 2016-01-20 DIAGNOSIS — Z3490 Encounter for supervision of normal pregnancy, unspecified, unspecified trimester: Secondary | ICD-10-CM

## 2016-01-20 DIAGNOSIS — O99013 Anemia complicating pregnancy, third trimester: Secondary | ICD-10-CM

## 2016-01-20 DIAGNOSIS — O0990 Supervision of high risk pregnancy, unspecified, unspecified trimester: Secondary | ICD-10-CM

## 2016-01-20 DIAGNOSIS — Z3A37 37 weeks gestation of pregnancy: Secondary | ICD-10-CM

## 2016-01-20 DIAGNOSIS — O30033 Twin pregnancy, monochorionic/diamniotic, third trimester: Secondary | ICD-10-CM | POA: Diagnosis present

## 2016-01-20 DIAGNOSIS — O9902 Anemia complicating childbirth: Secondary | ICD-10-CM | POA: Diagnosis present

## 2016-01-20 DIAGNOSIS — O411230 Chorioamnionitis, third trimester, not applicable or unspecified: Secondary | ICD-10-CM | POA: Diagnosis present

## 2016-01-20 DIAGNOSIS — O09899 Supervision of other high risk pregnancies, unspecified trimester: Secondary | ICD-10-CM | POA: Diagnosis present

## 2016-01-20 DIAGNOSIS — Z2839 Other underimmunization status: Secondary | ICD-10-CM | POA: Diagnosis present

## 2016-01-20 DIAGNOSIS — O4690 Antepartum hemorrhage, unspecified, unspecified trimester: Secondary | ICD-10-CM

## 2016-01-20 DIAGNOSIS — K219 Gastro-esophageal reflux disease without esophagitis: Secondary | ICD-10-CM | POA: Diagnosis present

## 2016-01-20 DIAGNOSIS — O411290 Chorioamnionitis, unspecified trimester, not applicable or unspecified: Secondary | ICD-10-CM | POA: Diagnosis present

## 2016-01-20 DIAGNOSIS — O30039 Twin pregnancy, monochorionic/diamniotic, unspecified trimester: Secondary | ICD-10-CM

## 2016-01-20 DIAGNOSIS — O1493 Unspecified pre-eclampsia, third trimester: Secondary | ICD-10-CM

## 2016-01-20 LAB — POSTPARTUM HEMORRHAGE PROTOCOL (BB NOTIFICATION)

## 2016-01-20 LAB — COMPREHENSIVE METABOLIC PANEL
ALT: 16 U/L (ref 14–54)
AST: 26 U/L (ref 15–41)
Albumin: 2.4 g/dL — ABNORMAL LOW (ref 3.5–5.0)
Alkaline Phosphatase: 299 U/L — ABNORMAL HIGH (ref 38–126)
Anion gap: 7 (ref 5–15)
BILIRUBIN TOTAL: 1.1 mg/dL (ref 0.3–1.2)
BUN: 13 mg/dL (ref 6–20)
CO2: 22 mmol/L (ref 22–32)
CREATININE: 0.61 mg/dL (ref 0.44–1.00)
Calcium: 8.6 mg/dL — ABNORMAL LOW (ref 8.9–10.3)
Chloride: 108 mmol/L (ref 101–111)
Glucose, Bld: 78 mg/dL (ref 65–99)
POTASSIUM: 4.1 mmol/L (ref 3.5–5.1)
Sodium: 137 mmol/L (ref 135–145)
TOTAL PROTEIN: 5.7 g/dL — AB (ref 6.5–8.1)

## 2016-01-20 LAB — CBC
HEMATOCRIT: 23.2 % — AB (ref 36.0–46.0)
HEMATOCRIT: 20 % — AB (ref 36.0–46.0)
HEMOGLOBIN: 6.5 g/dL — AB (ref 12.0–15.0)
Hemoglobin: 7.4 g/dL — ABNORMAL LOW (ref 12.0–15.0)
MCH: 25.8 pg — AB (ref 26.0–34.0)
MCH: 25.8 pg — AB (ref 26.0–34.0)
MCHC: 31.9 g/dL (ref 30.0–36.0)
MCHC: 32.5 g/dL (ref 30.0–36.0)
MCV: 80.8 fL (ref 78.0–100.0)
MCV: 79.4 fL (ref 78.0–100.0)
Platelets: 240 10*3/uL (ref 150–400)
Platelets: 233 10*3/uL (ref 150–400)
RBC: 2.87 MIL/uL — ABNORMAL LOW (ref 3.87–5.11)
RBC: 2.52 MIL/uL — ABNORMAL LOW (ref 3.87–5.11)
RDW: 14.5 % (ref 11.5–15.5)
RDW: 14.9 % (ref 11.5–15.5)
WBC: 5.1 10*3/uL (ref 4.0–10.5)
WBC: 13.8 10*3/uL — ABNORMAL HIGH (ref 4.0–10.5)

## 2016-01-20 LAB — DIC (DISSEMINATED INTRAVASCULAR COAGULATION) PANEL
D DIMER QUANT: 14.08 ug{FEU}/mL — AB (ref 0.00–0.50)
PLATELETS: 233 10*3/uL (ref 150–400)
SMEAR REVIEW: NONE SEEN

## 2016-01-20 LAB — PREPARE RBC (CROSSMATCH)

## 2016-01-20 LAB — DIC (DISSEMINATED INTRAVASCULAR COAGULATION)PANEL
Fibrinogen: 378 mg/dL (ref 204–475)
INR: 1.05 (ref 0.00–1.49)
Prothrombin Time: 13.9 seconds (ref 11.6–15.2)
aPTT: 27 seconds (ref 24–37)

## 2016-01-20 LAB — ABO/RH: ABO/RH(D): A POS

## 2016-01-20 LAB — MAGNESIUM: MAGNESIUM: 5.3 mg/dL — AB (ref 1.7–2.4)

## 2016-01-20 MED ORDER — ZOLPIDEM TARTRATE 5 MG PO TABS: 5.0000 mg | ORAL_TABLET | Freq: Every evening | ORAL | Status: DC | PRN

## 2016-01-20 MED ORDER — TERBUTALINE SULFATE 1 MG/ML IJ SOLN: 0.2500 mg | Freq: Once | INTRAMUSCULAR | Status: DC | PRN

## 2016-01-20 MED ORDER — LACTATED RINGERS IV SOLN
INTRAVENOUS | Status: DC
  Administered 2016-01-20: 09:00:00 via INTRAVENOUS

## 2016-01-20 MED ORDER — MISOPROSTOL 200 MCG PO TABS
800.0000 ug | ORAL_TABLET | Freq: Once | ORAL | Status: DC
Start: 2016-01-20 — End: 2016-01-22

## 2016-01-20 MED ORDER — EPHEDRINE 5 MG/ML INJ
10.0000 mg | INTRAVENOUS | Status: DC | PRN
  Filled 2016-01-20: qty 2

## 2016-01-20 MED ORDER — DIPHENHYDRAMINE HCL 25 MG PO CAPS: 25.0000 mg | ORAL_CAPSULE | Freq: Four times a day (QID) | ORAL | Status: DC | PRN

## 2016-01-20 MED ORDER — ONDANSETRON HCL 4 MG/2ML IJ SOLN: 4.0000 mg | INTRAMUSCULAR | Status: DC | PRN

## 2016-01-20 MED ORDER — LABETALOL HCL 5 MG/ML IV SOLN
INTRAVENOUS | Status: AC
  Filled 2016-01-20: qty 4

## 2016-01-20 MED ORDER — OXYCODONE-ACETAMINOPHEN 5-325 MG PO TABS: 1.0000 | ORAL_TABLET | ORAL | Status: DC | PRN

## 2016-01-20 MED ORDER — OXYCODONE-ACETAMINOPHEN 5-325 MG PO TABS
1.0000 | ORAL_TABLET | ORAL | Status: DC | PRN
  Administered 2016-01-21 – 2016-01-22 (×2): 1 via ORAL
  Filled 2016-01-20 (×2): qty 1

## 2016-01-20 MED ORDER — DOCUSATE SODIUM 100 MG PO CAPS
100.0000 mg | ORAL_CAPSULE | Freq: Two times a day (BID) | ORAL | Status: DC
  Administered 2016-01-21 – 2016-01-22 (×4): 100 mg via ORAL
  Filled 2016-01-20 (×4): qty 1

## 2016-01-20 MED ORDER — LACTATED RINGERS IV SOLN
500.0000 mL | Freq: Once | INTRAVENOUS | Status: AC
  Administered 2016-01-20: 500 mL via INTRAVENOUS

## 2016-01-20 MED ORDER — DIPHENHYDRAMINE HCL 50 MG/ML IJ SOLN: 12.5000 mg | INTRAMUSCULAR | Status: DC | PRN

## 2016-01-20 MED ORDER — MISOPROSTOL 25 MCG QUARTER TABLET
25.0000 ug | ORAL_TABLET | ORAL | Status: DC | PRN
  Filled 2016-01-20: qty 1

## 2016-01-20 MED ORDER — LANOLIN HYDROUS EX OINT: TOPICAL_OINTMENT | CUTANEOUS | Status: DC | PRN

## 2016-01-20 MED ORDER — OXYTOCIN 10 UNIT/ML IJ SOLN: 10.0000 [IU]/h | INTRAVENOUS | Status: DC

## 2016-01-20 MED ORDER — PHENYLEPHRINE 40 MCG/ML (10ML) SYRINGE FOR IV PUSH (FOR BLOOD PRESSURE SUPPORT)
80.0000 ug | PREFILLED_SYRINGE | INTRAVENOUS | Status: DC | PRN
  Filled 2016-01-20: qty 2

## 2016-01-20 MED ORDER — LIDOCAINE HCL (PF) 1 % IJ SOLN
30.0000 mL | INTRAMUSCULAR | Status: DC | PRN
  Filled 2016-01-20: qty 30

## 2016-01-20 MED ORDER — ACETAMINOPHEN 325 MG PO TABS
650.0000 mg | ORAL_TABLET | ORAL | Status: DC | PRN
  Administered 2016-01-20 (×2): 650 mg via ORAL
  Filled 2016-01-20 (×2): qty 2

## 2016-01-20 MED ORDER — LABETALOL HCL 5 MG/ML IV SOLN
20.0000 mg | INTRAVENOUS | Status: DC | PRN
  Administered 2016-01-20: 20 mg via INTRAVENOUS

## 2016-01-20 MED ORDER — SODIUM CHLORIDE 0.9 % IV SOLN
Freq: Once | INTRAVENOUS | Status: DC
Start: 2016-01-20 — End: 2016-01-20

## 2016-01-20 MED ORDER — FLEET ENEMA 7-19 GM/118ML RE ENEM: 1.0000 | ENEMA | Freq: Every day | RECTAL | Status: DC | PRN

## 2016-01-20 MED ORDER — WITCH HAZEL-GLYCERIN EX PADS: 1.0000 "application " | MEDICATED_PAD | CUTANEOUS | Status: DC | PRN

## 2016-01-20 MED ORDER — MEASLES, MUMPS & RUBELLA VAC ~~LOC~~ INJ: 0.5000 mL | INJECTION | Freq: Once | SUBCUTANEOUS | Status: DC

## 2016-01-20 MED ORDER — IBUPROFEN 600 MG PO TABS
600.0000 mg | ORAL_TABLET | Freq: Four times a day (QID) | ORAL | Status: DC
  Administered 2016-01-21 – 2016-01-22 (×7): 600 mg via ORAL
  Filled 2016-01-20 (×7): qty 1

## 2016-01-20 MED ORDER — MISOPROSTOL 200 MCG PO TABS
ORAL_TABLET | ORAL | Status: AC
  Administered 2016-01-20: 800 ug
  Filled 2016-01-20: qty 4

## 2016-01-20 MED ORDER — ONDANSETRON HCL 4 MG PO TABS
4.0000 mg | ORAL_TABLET | ORAL | Status: DC | PRN
  Administered 2016-01-21: 4 mg via ORAL
  Filled 2016-01-20: qty 1

## 2016-01-20 MED ORDER — DEXTROSE 5 % IV SOLN
140.0000 mg | Freq: Three times a day (TID) | INTRAVENOUS | Status: DC
  Administered 2016-01-20 (×2): 140 mg via INTRAVENOUS
  Filled 2016-01-20 (×2): qty 3.5

## 2016-01-20 MED ORDER — ONDANSETRON HCL 4 MG/2ML IJ SOLN
4.0000 mg | Freq: Four times a day (QID) | INTRAMUSCULAR | Status: DC | PRN
  Administered 2016-01-20: 4 mg via INTRAVENOUS
  Filled 2016-01-20: qty 2

## 2016-01-20 MED ORDER — METHYLERGONOVINE MALEATE 0.2 MG/ML IJ SOLN: 0.2000 mg | INTRAMUSCULAR | Status: DC | PRN

## 2016-01-20 MED ORDER — SODIUM CHLORIDE 0.9 % IV SOLN
2.0000 g | Freq: Four times a day (QID) | INTRAVENOUS | Status: DC
  Administered 2016-01-20 (×2): 2 g via INTRAVENOUS
  Filled 2016-01-20 (×3): qty 2000

## 2016-01-20 MED ORDER — LIDOCAINE HCL (PF) 1 % IJ SOLN
INTRAMUSCULAR | Status: DC | PRN
  Administered 2016-01-20 (×2): 4 mL via EPIDURAL

## 2016-01-20 MED ORDER — FENTANYL CITRATE (PF) 100 MCG/2ML IJ SOLN: 50.0000 ug | INTRAMUSCULAR | Status: DC | PRN

## 2016-01-20 MED ORDER — ACETAMINOPHEN 325 MG PO TABS
650.0000 mg | ORAL_TABLET | ORAL | Status: DC | PRN
  Administered 2016-01-21: 650 mg via ORAL
  Filled 2016-01-20: qty 2

## 2016-01-20 MED ORDER — MAGNESIUM SULFATE BOLUS VIA INFUSION
6.0000 g | Freq: Once | INTRAVENOUS | Status: AC
  Administered 2016-01-20: 6 g via INTRAVENOUS
  Filled 2016-01-20: qty 500

## 2016-01-20 MED ORDER — SIMETHICONE 80 MG PO CHEW: 80.0000 mg | CHEWABLE_TABLET | ORAL | Status: DC | PRN

## 2016-01-20 MED ORDER — BENZOCAINE-MENTHOL 20-0.5 % EX AERO
1.0000 "application " | INHALATION_SPRAY | CUTANEOUS | Status: DC | PRN
  Administered 2016-01-21: 1 via TOPICAL
  Filled 2016-01-20: qty 56

## 2016-01-20 MED ORDER — FENTANYL 2.5 MCG/ML BUPIVACAINE 1/10 % EPIDURAL INFUSION (WH - ANES)
14.0000 mL/h | INTRAMUSCULAR | Status: DC | PRN
  Administered 2016-01-20: 12 mL/h via EPIDURAL
  Administered 2016-01-20: 14 mL/h via EPIDURAL
  Filled 2016-01-20 (×3): qty 125

## 2016-01-20 MED ORDER — FLEET ENEMA 7-19 GM/118ML RE ENEM: 1.0000 | ENEMA | RECTAL | Status: DC | PRN

## 2016-01-20 MED ORDER — OXYTOCIN BOLUS FROM INFUSION: 500.0000 mL | INTRAVENOUS | Status: DC

## 2016-01-20 MED ORDER — METHYLERGONOVINE MALEATE 0.2 MG PO TABS: 0.2000 mg | ORAL_TABLET | ORAL | Status: DC | PRN

## 2016-01-20 MED ORDER — OXYTOCIN 10 UNIT/ML IJ SOLN: 2.5000 [IU]/h | INTRAVENOUS | Status: DC | PRN

## 2016-01-20 MED ORDER — CITRIC ACID-SODIUM CITRATE 334-500 MG/5ML PO SOLN: 30.0000 mL | ORAL | Status: DC | PRN

## 2016-01-20 MED ORDER — BISACODYL 10 MG RE SUPP: 10.0000 mg | Freq: Every day | RECTAL | Status: DC | PRN

## 2016-01-20 MED ORDER — TETANUS-DIPHTH-ACELL PERTUSSIS 5-2.5-18.5 LF-MCG/0.5 IM SUSP: 0.5000 mL | Freq: Once | INTRAMUSCULAR | Status: DC

## 2016-01-20 MED ORDER — PHENYLEPHRINE 40 MCG/ML (10ML) SYRINGE FOR IV PUSH (FOR BLOOD PRESSURE SUPPORT)
80.0000 ug | PREFILLED_SYRINGE | INTRAVENOUS | Status: DC | PRN
  Filled 2016-01-20: qty 2
  Filled 2016-01-20: qty 20

## 2016-01-20 MED ORDER — OXYCODONE-ACETAMINOPHEN 5-325 MG PO TABS
2.0000 | ORAL_TABLET | ORAL | Status: DC | PRN
  Administered 2016-01-21: 2 via ORAL
  Filled 2016-01-20: qty 2

## 2016-01-20 MED ORDER — DIBUCAINE 1 % RE OINT: 1.0000 "application " | TOPICAL_OINTMENT | RECTAL | Status: DC | PRN

## 2016-01-20 MED ORDER — PRENATAL MULTIVITAMIN CH
1.0000 | ORAL_TABLET | Freq: Every day | ORAL | Status: DC
Start: 2016-01-21 — End: 2016-01-22
  Administered 2016-01-21 – 2016-01-22 (×2): 1 via ORAL
  Filled 2016-01-20 (×2): qty 1

## 2016-01-20 MED ORDER — OXYTOCIN 10 UNIT/ML IJ SOLN
1.0000 m[IU]/min | INTRAVENOUS | Status: DC
  Administered 2016-01-20: 2 m[IU]/min via INTRAVENOUS
  Administered 2016-01-20: 666 m[IU]/min via INTRAVENOUS
  Filled 2016-01-20: qty 10

## 2016-01-20 MED ORDER — HYDRALAZINE HCL 20 MG/ML IJ SOLN: 10.0000 mg | Freq: Once | INTRAMUSCULAR | Status: DC | PRN

## 2016-01-20 MED ORDER — OXYCODONE-ACETAMINOPHEN 5-325 MG PO TABS: 2.0000 | ORAL_TABLET | ORAL | Status: DC | PRN

## 2016-01-20 MED ORDER — SODIUM CHLORIDE 0.9 % IV SOLN: Freq: Once | INTRAVENOUS | Status: DC

## 2016-01-20 MED ORDER — MAGNESIUM SULFATE 50 % IJ SOLN
2.0000 g/h | INTRAVENOUS | Status: DC
  Administered 2016-01-20: 2 g/h via INTRAVENOUS
  Filled 2016-01-20: qty 80

## 2016-01-20 MED ORDER — FENTANYL CITRATE (PF) 100 MCG/2ML IJ SOLN
INTRAMUSCULAR | Status: AC
  Administered 2016-01-20: 100 ug
  Filled 2016-01-20: qty 2

## 2016-01-20 MED ORDER — LACTATED RINGERS IV SOLN: 500.0000 mL | INTRAVENOUS | Status: DC | PRN

## 2016-01-20 MED ORDER — OXYTOCIN 10 UNIT/ML IJ SOLN: 2.5000 [IU]/h | INTRAVENOUS | Status: DC

## 2016-01-20 NOTE — Progress Notes (Signed)
Pamela [name] is a 20 y.o. G1P0 at 1016w0d by ultrasound admitted for induction of labor due to Pre-eclamptic toxemia of pregnancy..  Subjective:   Objective: BP 144/76 mmHg  Pulse 101  Temp(Src) 98.8 F (37.1 C) (Axillary)  Resp 18  Ht 5\' 3"  (1.6 m)  Wt 152 lb (68.947 kg)  BMI 26.93 kg/m2  SpO2 97%  LMP 05/01/2015   Total I/O In: 1808.5 [P.O.:480; I.V.:1225; IV Piggyback:103.5] Out: 900 [Urine:650; Emesis/NG output:250]  FHT:  FHR: 130's bpm, variability: moderate,  accelerations:  Present,  decelerations:  Absent UC:   regular, every 3-4 minutes SVE:   Dilation: 5 Effacement (%): 50 Station: -3 Exam by:: Dr Erin FullingHarraway Smith  S/p AROM and placement of IUPC  Labs: Lab Results  Component Value Date   WBC 5.1 01/20/2016   HGB 7.4* 01/20/2016   HCT 23.2* 01/20/2016   MCV 80.8 01/20/2016   PLT 240 01/20/2016    Assessment / Plan: Induction of labor due to preeclampsia,  progressing well on pitocin.  Pt with fever and fetal tachycardia which has now imporved with Tylenol and Amp/Gent  Labor: Progressing normally Preeclampsia:  on magnesium sulfate Fetal Wellbeing:  Category I Pain Control:  Epidural I/D:  n/a Anticipated MOD:  NSVD  HARRAWAY-SMITH, Pamela [name] 01/20/2016, 5:14 PM

## 2016-01-20 NOTE — Anesthesia Procedure Notes (Signed)
Epidural Patient location during procedure: OB Start time: 01/20/2016 10:35 AM  Staffing Anesthesiologist: Mal AmabileFOSTER, Unita Detamore Performed by: anesthesiologist   Preanesthetic Checklist Completed: patient identified, site marked, surgical consent, pre-op evaluation, timeout performed, IV checked, risks and benefits discussed and monitors and equipment checked  Epidural Patient position: sitting Prep: site prepped and draped and DuraPrep Patient monitoring: continuous pulse ox and blood pressure Approach: midline Location: L3-L4 Injection technique: LOR air  Needle:  Needle type: Tuohy  Needle gauge: 17 G Needle length: 9 cm and 9 Needle insertion depth: 5 cm cm Catheter type: closed end flexible Catheter size: 19 Gauge Catheter at skin depth: 10 cm Test dose: negative and Other  Assessment Events: blood not aspirated, injection not painful, no injection resistance, negative IV test and no paresthesia  Additional Notes Patient identified. Risks and benefits discussed including failed block, incomplete  Pain control, post dural puncture headache, nerve damage, paralysis, blood pressure Changes, nausea, vomiting, reactions to medications-both toxic and allergic and post Partum back pain. All questions were answered. Patient expressed understanding and wished to proceed. Sterile technique was used throughout procedure. Epidural site was Dressed with sterile barrier dressing. No paresthesias, signs of intravascular injection Or signs of intrathecal spread were encountered.  Patient was more comfortable after the epidural was dosed. Please see RN's note for documentation of vital signs and FHR which are stable.

## 2016-01-20 NOTE — Progress Notes (Signed)
Lisa leftwich-kirby called - made aware that pt c/o h/a with blurred vision - order to give tylenol 650mg  and will come check on pt soon

## 2016-01-20 NOTE — Progress Notes (Signed)
Pamela [name] is a 20 y.o. G1P0 at 4424w0d admitted for induction of labor due to preeclampsia with severe features.  Subjective: Pt comfortable with epidural, feeling nauseous, family in room for support.  Objective: BP 135/60 mmHg  Pulse 124  Temp(Src) 101.1 F (38.4 C) (Axillary)  Resp 18  Ht 5\' 3"  (1.6 m)  Wt 152 lb (68.947 kg)  BMI 26.93 kg/m2  SpO2 98%  LMP 05/01/2015   Total I/O In: 60 [P.O.:60] Out: 475 [Urine:225; Emesis/NG output:250]  FHT:  Baby A: FHR: 175 bpm, variability: moderate,  accelerations:  Present,  decelerations:  Absent  Baby B: FHR: 175 bpm, variability: moderate,  accelerations:  Present,  decelerations:  Absent UC:   regular, every 2-3 minutes, moderate to palpation SVE:   Dilation: 5 Effacement (%): 50 Station: -3 Exam by:: Marcelino DusterMichelle, RN    Notified by RN of elevated temp x 2 in 30 minutes.  100.5, then 101.1.    Labs: Lab Results  Component Value Date   WBC 5.1 01/20/2016   HGB 7.4* 01/20/2016   HCT 23.2* 01/20/2016   MCV 80.8 01/20/2016   PLT 240 01/20/2016    Assessment / Plan: Induction of labor due to preeclampsia with severe features,  progressing well on pitocin Intraamniotic infection/inflammation   Labor: Progressing normally.  Triple I/chorioamnionitis noted with with maternal fever and fetal and maternal tachycardia.  Start Ampicillin 2g Q 6 hours, Gentamycin 1.5 mg/kg Q 8 hours.  RN started Mag sulfate with 4g bolus then 2g/hour and Pitocin @ 2 milliunts per Dr [name]. Preeclampsia:  on magnesium sulfate, intake and ouput balanced and labs stable Fetal Wellbeing:  Category II with fetal tachycardia Pain Control:  Epidural I/D:   Anticipated MOD:  NSVD  LEFTWICH-KIRBY, LISA 01/20/2016, 1:08 PM

## 2016-01-20 NOTE — Progress Notes (Signed)
Pamela [name] is a 20 y.o. G1P0 at 7632w0d by ultrasound admitted for induction of labor due to Pre-eclamptic toxemia of pregnancy..  Subjective: Ot c/o pain with ctx. She is now s/p epidural and is comfortable.   Objective: BP 151/101 mmHg  Pulse 137  Temp(Src) 100.5 F (38.1 C) (Oral)  Resp 18  Ht 5\' 3"  (1.6 m)  Wt 152 lb (68.947 kg)  BMI 26.93 kg/m2  SpO2 97%  LMP 05/01/2015     FHT:  FHR: 140's/150's bpm, variability: moderate,  accelerations:  Present,  decelerations:  Absent UC:   regular, every 2-3 minutes SVE:   Dilation: 5 Effacement (%): 50 Station: -3 Exam by:: Marcelino DusterMichelle, RN   Labs: Lab Results  Component Value Date   WBC 5.1 01/20/2016   HGB 7.4* 01/20/2016   HCT 23.2* 01/20/2016   MCV 80.8 01/20/2016   PLT 240 01/20/2016    Assessment / Plan: Induction of labor due to preeclampsia and twin gestation,  progressing well on pitocin  Labor: Progressing normally Preeclampsia:  starting antihypertensives and Magnesium sulfate Fetal Wellbeing:  Category I Pain Control:  Epidural I/D:  n/a Anticipated MOD:  NSVD  HARRAWAY-SMITH, Pamela [name] 01/20/2016, 12:23 PM

## 2016-01-20 NOTE — Anesthesia Preprocedure Evaluation (Signed)
Anesthesia Evaluation  Patient identified by MRN, date of birth, ID band Patient awake    Reviewed: Allergy & Precautions, NPO status , Patient's Chart, lab work & pertinent test results  Airway Mallampati: III  TM Distance: >3 FB Neck ROM: Full    Dental no notable dental hx. (+) Teeth Intact   Pulmonary neg pulmonary ROS,    Pulmonary exam normal breath sounds clear to auscultation       Cardiovascular hypertension, Normal cardiovascular exam Rhythm:Regular Rate:Normal  Pre eclampsia   Neuro/Psych PSYCHIATRIC DISORDERS negative neurological ROS     GI/Hepatic Neg liver ROS, GERD  ,  Endo/Other  negative endocrine ROS  Renal/GU negative Renal ROS  negative genitourinary   Musculoskeletal negative musculoskeletal ROS (+)   Abdominal   Peds  Hematology  (+) anemia ,   Anesthesia Other Findings   Reproductive/Obstetrics (+) Pregnancy Twin Gestation 37 weeks Twin A Vertex Twin B Transverse                             Anesthesia Physical Anesthesia Plan  ASA: III  Anesthesia Plan: Epidural   Post-op Pain Management:    Induction:   Airway Management Planned: Natural Airway  Additional Equipment:   Intra-op Plan:   Post-operative Plan:   Informed Consent: I have reviewed the patients History and Physical, chart, labs and discussed the procedure including the risks, benefits and alternatives for the proposed anesthesia with the patient or authorized representative who has indicated his/her understanding and acceptance.     Plan Discussed with: Anesthesiologist  Anesthesia Plan Comments:         Anesthesia Quick Evaluation

## 2016-01-20 NOTE — Progress Notes (Signed)
ANTIBIOTIC CONSULT NOTE - INITIAL  Pharmacy Consult for Gentamicin Indication: Chorioamnionitis   No Known Allergies  Patient Measurements: Height: 5\' 3"  (160 cm) Weight: 152 lb (68.947 kg) IBW/kg (Calculated) : 52.4 Adjusted Body Weight: 57.3 kg  Vital Signs: Temp: 100.5 F (38.1 C) (03/11 1218) Temp Source: Oral (03/11 1218) BP: 157/81 mmHg (03/11 1240) Pulse Rate: 126 (03/11 1240)  Labs:  Recent Labs  01/20/16 0735  WBC 5.1  HGB 7.4*  PLT 240  CREATININE 0.61   No results for input(s): GENTTROUGH, GENTPEAK, GENTRANDOM in the last 72 hours.   Microbiology: Recent Results (from the past 720 hour(s))  OB Urine Culture     Status: None   Collection Time: 01/01/16  9:04 PM  Result Value Ref Range Status   Specimen Description URINE, CLEAN CATCH  Final   Special Requests NONE  Final   Culture   Final    MULTIPLE SPECIES PRESENT, SUGGEST RECOLLECTION [facility]    Report Status 01/03/2016 FINAL  Final  Culture, beta strep (group b only)     Status: None   Collection Time: 01/11/16 12:30 PM  Result Value Ref Range Status   Organism ID, Bacteria NO GROUP B STREP (S.AGALACTIAE) ISOLATED  Final    Medications:  Ampicillin 2gm IV q6hrs  Assessment: 20 y.o. female G1P0 at 2482w0d on amp and gent for chorio.  Estimated Ke = 0.37, Vd = 0.4L/kg  Goal of Therapy:  Gentamicin peak 6-8 mg/L and Trough < 1 mg/L  Plan:  Gentamicin 140 mg IV every 8 hrs  Check Scr with next labs if gentamicin continued. Will check gentamicin levels if continued > 72hr or clinically indicated.   Wendie Simmerynthia Isaak Delmundo, PharmD, BCPS Clinical Pharmacist   01/20/2016,12:47 PM

## 2016-01-20 NOTE — H&P (Signed)
LABOR AND DELIVERY ADMISSION HISTORY AND PHYSICAL NOTE  Pamela [name] is a 20 y.o. female G1P0 with IUP at 2039w0d by 13 wk u/s  presenting for iol 2/2 mo-di twin gestation and mild preeclampsia.   She reports positive fetal movement. She denies leakage of fluid or vaginal bleeding.  Prenatal History/Complications:  Past Medical History: Past Medical History  Diagnosis Date  . Medical history non-contributory     Past Surgical History: Past Surgical History  Procedure Laterality Date  . No past surgeries      Obstetrical History: OB History    Gravida Para Term Preterm AB TAB SAB Ectopic Multiple Living   1         0      Social History: Social History   Social History  . Marital Status: Single    Spouse Name: N/A  . Number of Children: N/A  . Years of Education: N/A   Social History Main Topics  . Smoking status: Never Smoker   . Smokeless tobacco: None  . Alcohol Use: No  . Drug Use: No  . Sexual Activity: Yes    Birth Control/ Protection: None   Other Topics Concern  . None   Social History Narrative    Family History: Family History  Problem Relation Age of Onset  . Diabetes Mother   . Asthma Sister     Allergies: No Known Allergies  Prescriptions prior to admission  Medication Sig Dispense Refill Last Dose  . ferrous sulfate (FERROUSUL) 325 (65 FE) MG tablet Take 1 tablet (325 mg total) by mouth 2 (two) times daily. 60 tablet 3 Taking  . Prenatal Vit-Fe Fumarate-FA (PRENATAL VITAMIN PO) Take 1 tablet by mouth daily.    Taking  . promethazine (PHENERGAN) 25 MG tablet Take 1 tablet (25 mg total) by mouth every 6 (six) hours as needed for nausea or vomiting. 30 tablet 2 Taking     Review of Systems   All systems reviewed and negative except as stated in HPI  Blood pressure 157/97, pulse 93, temperature 98 F (36.7 C), temperature source Oral, resp. rate 18, last menstrual period 05/01/2015. General appearance: alert, cooperative and  appears stated age Lungs: clear to auscultation bilaterally Heart: regular rate and rhythm Abdomen: soft, non-tender; bowel sounds normal Extremities: No calf swelling or tenderness Presentation: cephalic Fetal monitoring: 140/mod/+a/-d, 145/mod/+a/-d Uterine activity: quiet  Dilation: 1.5 Effacement (%): 30 Station: -3   Prenatal labs: ABO, Rh: A/Positive/-- (09/22 0000) Antibody: Negative (09/22 0000) Rubella: !Error!imm RPR: NON REAC (01/05 0955)  HBsAg: Negative (09/22 0000)  HIV: NONREACTIVE (01/05 0955)  GBS:   neg 1 hr Glucola: 145, 3 hr 74/140/108/100 Genetic screening:  Too late Anatomy US: wnl, twins  Prenatal Transfer Tool  Maternal Diabetes: No Genetic Screening: Declined Maternal Ultrasounds/Referrals: twins Fetal Ultrasounds or other Referrals:  Referred to Materal Fetal Medicine  Maternal Substance Abuse:  No Significant Maternal Medications:  none Significant Maternal Lab Results: Lab values include: Group B Strep negative, Other: severe anemia  No results found for this or any previous visit (from the past 24 hour(s)).  Patient Active Problem List   Diagnosis Date Noted  . Anemia of mother in pregnancy, antepartum 01/05/2016  . Hypertension in pregnancy, preeclampsia 01/04/2016  . Pica 12/14/2015  . Vaginal bleeding during pregnancy, antepartum 12/06/2015  . High-risk pregnancy supervision 11/16/2015  . Susceptible to varicella (non-immune), currently pregnant 10/31/2015  . Monochorionic diamniotic twin pregnancy, antepartum 10/30/2015    Assessment: Pamela Fickbony R Bridwell is  a 20 y.o. G1P0 at [redacted]w[redacted]d here for iol 2/2 preE and mo-di twins.  #Labor: foley placed. Vaginal cytotec ordered. Some bleeding with foley placement. fhts remain normal. No previa noted on prior u/s. #Pain: Eventual epidural #FWB: Cat 1 #ID:  gbs neg #MOF: breast/bottle #MOC:depo #Circ:  N/a #preE: mild thus far. upc elevated. hellp labs ordered and pending. No symptoms of severe  disease. Initial bp moderately elevated, will monitor closely #Anemia: h 7.6 one week ago. Repeating cbc today. Ordering 2 units prbcs on hold #mo-di twins: twin a is vertex. Fetal growth concordant last u/s.  Cherrie Gauze Wouk 01/20/2016, 7:56 AM

## 2016-01-21 ENCOUNTER — Encounter (HOSPITAL_COMMUNITY): Payer: Self-pay | Admitting: *Deleted

## 2016-01-21 DIAGNOSIS — O411290 Chorioamnionitis, unspecified trimester, not applicable or unspecified: Secondary | ICD-10-CM | POA: Diagnosis present

## 2016-01-21 LAB — CBC
HCT: 21.6 % — ABNORMAL LOW (ref 36.0–46.0)
Hemoglobin: 7.5 g/dL — ABNORMAL LOW (ref 12.0–15.0)
MCH: 27.3 pg (ref 26.0–34.0)
MCHC: 34.7 g/dL (ref 30.0–36.0)
MCV: 78.5 fL (ref 78.0–100.0)
PLATELETS: 160 10*3/uL (ref 150–400)
RBC: 2.75 MIL/uL — ABNORMAL LOW (ref 3.87–5.11)
RDW: 14.4 % (ref 11.5–15.5)
WBC: 12.1 10*3/uL — AB (ref 4.0–10.5)

## 2016-01-21 LAB — RPR: RPR: NONREACTIVE

## 2016-01-21 LAB — PREPARE RBC (CROSSMATCH)

## 2016-01-21 MED ORDER — LACTATED RINGERS IV SOLN
2.0000 g/h | INTRAVENOUS | Status: AC
  Administered 2016-01-21: 2 g/h via INTRAVENOUS
  Filled 2016-01-21: qty 80

## 2016-01-21 MED ORDER — SODIUM CHLORIDE 0.9 % IV SOLN
Freq: Once | INTRAVENOUS | Status: AC
  Administered 2016-01-21: 16:00:00 via INTRAVENOUS

## 2016-01-21 MED ORDER — FUROSEMIDE 10 MG/ML IJ SOLN
40.0000 mg | Freq: Once | INTRAMUSCULAR | Status: AC
  Administered 2016-01-21: 40 mg via INTRAVENOUS
  Filled 2016-01-21 (×2): qty 4

## 2016-01-21 MED ORDER — FUROSEMIDE 10 MG/ML IJ SOLN
20.0000 mg | Freq: Once | INTRAMUSCULAR | Status: AC
  Administered 2016-01-21: 20 mg via INTRAVENOUS
  Filled 2016-01-21: qty 2

## 2016-01-21 MED ORDER — LACTATED RINGERS IV SOLN
INTRAVENOUS | Status: DC
  Administered 2016-01-21 (×2): via INTRAVENOUS

## 2016-01-21 NOTE — Progress Notes (Addendum)
Post Partum Day 1 Subjective: no complaints and voiding. Pt c/o fatigue.  No SOB.  Pt s/p 2 units of PRBCs.  Receiving 3rd unit at present.  Objective: Blood pressure 140/87, pulse 97, temperature 97.7 F (36.5 C), temperature source Oral, resp. rate 16, height 5\' 3"  (1.6 m), weight 152 lb (68.947 kg), last menstrual period 05/01/2015, SpO2 100 %, unknown if currently breastfeeding. pt will be breast and bottle feeding.   Physical Exam:  BP 132/73 mmHg  Pulse 89  Temp(Src) 97.7 F (36.5 C) (Oral)  Resp 18  Ht 5\' 3"  (1.6 m)  Wt 152 lb (68.947 kg)  BMI 26.93 kg/m2  SpO2 100%  LMP 05/01/2015  Breastfeeding? Unknown I/O last 3 completed shifts: In: 6236.3 [P.O.:1160; I.V.:4554.3; Blood:365; IV Piggyback:157] Out: 4310 [Urine:2025; Emesis/NG output:250; Blood:2035]    General: alert and no distress  HEENT: facila edema Lungs: CTA CV: RRR Lochia: appropriate Uterine Fundus: firm; appropriately tender DVT Evaluation: No evidence of DVT seen on physical exam. Etx: bilateral 2+ nonpitting edema of hands and feet- symmetric   Recent Labs  01/20/16 0735 01/20/16 2208  HGB 7.4* 6.5*  HCT 23.2* 20.0*    Assessment/Plan: Lactation consult  preeclampsia with severe features on magnesium sulfate- clinically improved Suspected Triple I on atbx in labor. No stoped Postpartum hemorrhage in the face of severe anemia antepartum- will be s/p 3 units of PRBCs Continue magnesium sulfate to complete 24 hours. CBC after blood transfusion   LOS: 1 day   HARRAWAY-SMITH, Danene Montijo 01/21/2016, 6:39 AM

## 2016-01-21 NOTE — Progress Notes (Signed)
Lactation consult called

## 2016-01-21 NOTE — Anesthesia Postprocedure Evaluation (Signed)
Anesthesia Post Note  Patient: Pamela [name]  Procedure(s) Performed: * No procedures listed *  Patient location during evaluation: [facility] Anesthesia Type: Epidural Level of consciousness: awake and alert Pain management: pain level controlled Vital Signs Assessment: post-procedure vital signs reviewed and stable Respiratory status: spontaneous breathing, nonlabored ventilation and respiratory function stable Cardiovascular status: stable Postop Assessment: no headache, no backache and epidural receding Anesthetic complications: no    Last Vitals:  Filed Vitals:   01/21/16 0700 01/21/16 0830  BP: 132/73 141/75  Pulse: 89 93  Temp:  36.6 C  Resp: 18 16    Last Pain:  Filed Vitals:   01/21/16 0832  PainSc: 6                  Eziah Negro

## 2016-01-22 LAB — TYPE AND SCREEN
ABO/RH(D): A POS
Antibody Screen: NEGATIVE
UNIT DIVISION: 0
Unit division: 0
Unit division: 0

## 2016-01-22 LAB — CBC
HEMATOCRIT: 23.9 % — AB (ref 36.0–46.0)
Hemoglobin: 8.3 g/dL — ABNORMAL LOW (ref 12.0–15.0)
MCH: 27.2 pg (ref 26.0–34.0)
MCHC: 34.7 g/dL (ref 30.0–36.0)
MCV: 78.4 fL (ref 78.0–100.0)
Platelets: 161 10*3/uL (ref 150–400)
RBC: 3.05 MIL/uL — ABNORMAL LOW (ref 3.87–5.11)
RDW: 14.5 % (ref 11.5–15.5)
WBC: 10.9 10*3/uL — AB (ref 4.0–10.5)

## 2016-01-22 MED ORDER — AMLODIPINE BESYLATE 10 MG PO TABS: 10.0000 mg | ORAL_TABLET | Freq: Every day | ORAL | Status: DC

## 2016-01-22 MED ORDER — IBUPROFEN 600 MG PO TABS: 600.0000 mg | ORAL_TABLET | Freq: Four times a day (QID) | ORAL | Status: AC

## 2016-01-22 MED ORDER — AMLODIPINE BESYLATE 10 MG PO TABS
10.0000 mg | ORAL_TABLET | Freq: Every day | ORAL | Status: DC
  Administered 2016-01-22: 10 mg via ORAL
  Filled 2016-01-22 (×2): qty 1

## 2016-01-22 NOTE — Progress Notes (Signed)
Nurse tech has tried several times to obtain pts vitals.  Pt is busy moving about the room,.  Finally was able to get vitals and blood pressure was elevated, see vital flowsheets.  Dr. [place] Notified of elevated blood pressure because pt has discharge orders and I wanted to let them know about her BP incase they needed to change something with the discharge. This nurse  Instructed the patient to get off her feet and to get into the bed and rest.  MD stated he would talk it over with attending and get back to me.  Dr. [place] on unit at 1414 to talk to pt. New orders received.

## 2016-01-22 NOTE — Lactation Note (Signed)
This note was copied from a baby's chart. Lactation Consultation Note  Patient Name: Pamela [name] RUEAV'WToday's Date: 01/22/2016 Reason for consult: Follow-up assessment;Late preterm infant;Multiple gestation;Infant < 6lbs   Follow up with mom of 37 hour old twins.  Twin A Pamela [name] with 2 BF attempts, 6 bottles of Neocare 22 cal of 10-15 cc, 5 voids and 2 stools in last 24 hours. LATCH Scores 5 per bedside RN. Infant 4 lb 11.5 oz with a 5 % weigh loss in last 24 hours.  Twin B Pamela [name] with 7 BF for 10-30 minutes, 1 attempt, 5 voids and 2 stools in last 24 hours. LATCH Score 10 by bedside RN. Infant 4 lb 14.1 oz with weight loss of 6% in last 24 hours.  Mom reports she feels Pamela [name] is feeding well at the breast while Pamela [name] is not interested. Enc her to attempt both babies at breast as they are able.  Discussed that each infant needs to be fed at least every 3 hours for a total of 8 feeds in 24 hours. Discussed increasing volumes per LPT infant guidelines. Mom has not pumped since yesterday. Discussed that pump will provide stimulation to induce lactation. Mom does not have f/u Ped appts yet, [facility]. [facility]. She is interested in 2 week pump rental-paperwork left at bedside and phone # for mom to call me prior to d/c for pump rental Enc mom to call for next feeding to assess BF for both infants.  LPT infant policy reviewed with mom LC Brochure reviewed with mom, mom aware of LC Phone #, BF Support Groups and OP Services. Enc mom to call with questions/concerns prn   Maternal Data Formula Feeding for Exclusion: No Does the patient have breastfeeding experience prior to this delivery?: No  Feeding    LATCH Score/Interventions                      Lactation Tools Discussed/Used WIC Program: No (Plans to apply) Pump Review: Setup, frequency, and cleaning;Milk Storage   Consult Status Consult Status:  Complete Follow-up type: Call as needed    Ed BlalockSharon S Willowdean Luhmann 01/22/2016, 9:57 AM

## 2016-01-22 NOTE — Progress Notes (Signed)
Called by RN for elevated BP, consistently  150s/80-100s  Filed Vitals:   01/22/16 0034 01/22/16 0058 01/22/16 0608 01/22/16 1346  BP: 136/86  156/89 155/100  Pulse: 81 86 81 74  Temp:  98.4 F (36.9 C) 98.1 F (36.7 C) 98.4 F (36.9 C)  TempSrc:  Oral Oral Oral  Resp: 16 16 16 18   Height:      Weight:   138 lb 4 oz (62.71 kg)   SpO2: 100% 100% 100% 100%   A/P Georgianne Fickbony R Shelvin is a 20 y.o. G1P1002 on PPD#2 after NSVD of mono-di twins, preg complicated by Preeclampsia without severe features. Patient is at risk for PP elevations in BP  Gave 1 dose of amlodipine and home with Rx for amlodipine. Plan for baby love BP check in 48-72 hours. Discussed s/sx of preeclampsia and patient voiced understanding.   Federico FlakeKimberly Niles Letetia Romanello, MD

## 2016-01-22 NOTE — Discharge Summary (Signed)
OB Discharge Summary  Patient Name: Pamela [name] DOB: 27-Aug-1996 MRN: [id number]  Date of admission: 01/20/2016 Delivering MD:    Karmina, Zufall [[id number]]  Dakiyah, Heinke [[id number]]  [facility], CAROLYN   Date of discharge: 01/22/2016  Admitting diagnosis: 36 WKS INDUCTION Intrauterine pregnancy: [redacted]w[redacted]d     Secondary diagnosis:Active Problems:   Monochorionic diamniotic twin pregnancy, antepartum   Hypertension in pregnancy, preeclampsia   Pregnancy   Chorioamnionitis, delivered, current hospitalization  Additional problems:none     Discharge diagnosis: Term Pregnancy Delivered and Preeclampsia (mild)                                                                     Post partum procedures:none  Augmentation: Pitocin  Complications: None  Hospital course:  Induction of Labor With Vaginal Delivery   20 y.o. yo G1P1002 at [redacted]w[redacted]d was admitted to the hospital 01/20/2016 for induction of labor.  Indication for induction: Preeclampsia and twins.  Patient had an uncomplicated labor course as follows: Membrane Rupture Time/Date:    Navika, Hoopes [[id number]]  3:40 PM   Kayleena, Eke [[id number]]  3:40 PM ,   Jade, Burkard [[id number]]  01/20/2016   Saliha, Salts [[id number]]  01/20/2016   Intrapartum Procedures: Episiotomy:    Skiler, Tye [[id number]]  None [1]   Aimee, Heldman [[id number]]  None [1]                                         Lacerations:     Sharee, Sturdy [[id number]]  Periurethral [8];1st degree [2];Perineal [11]   Adrianna, Dudas [[id number]]  1st degree [2];Periurethral [8];Perineal [11]  Patient had delivery of a Viable infant.  Information for the patient's newborn:  Kaiyana, Bedore [[id number]]  Delivery Method: Vag-Spont Information for the patient's newborn:  Ellanie, Oppedisano [[id number]]  Delivery  Method: Vaginal, Spontaneous Delivery (Filed from Delivery Summary)     Aleiya, Rye [[id number]]  01/20/2016   Babe, Anthis [[id number]]  01/20/2016  Details of delivery can be found in separate delivery note.  Patient had a routine postpartum course. Patient is discharged home 01/22/2016.   Physical exam  Filed Vitals:   01/22/16 0000 01/22/16 0034 01/22/16 0058 01/22/16 0608  BP: 132/73 136/86  156/89  Pulse: 89 81 86 81  Temp:   98.4 F (36.9 C) 98.1 F (36.7 C)  TempSrc:   Oral Oral  Resp: Height:      Weight:    138 lb 4 oz (62.71 kg)  SpO2: 100% 100% 100% 100%   General: alert, cooperative and no distress Lochia: appropriate Uterine Fundus: firm Incision: N/A DVT Evaluation: No evidence of DVT seen on physical exam. Negative Homan's sign. No cords or calf tenderness. No significant calf/ankle edema. Labs: Lab Results  Component Value Date   WBC 10.9* 01/22/2016   HGB 8.3* 01/22/2016   HCT 23.9* 01/22/2016   MCV 78.4 01/22/2016   PLT 161 01/22/2016   CMP Latest Ref Rng 01/20/2016  Glucose 65 - 99 mg/dL 78  BUN 6 - 20 mg/dL 13  Creatinine 0.980.44 - 1.191.00 mg/dL 1.470.61  Sodium 829135 - 562145 mmol/L 137  Potassium 3.5 - 5.1 mmol/L 4.1  Chloride 101 - 111 mmol/L 108  CO2 22 - 32 mmol/L 22  Calcium 8.9 - 10.3 mg/dL 1.3(Y8.6(L)  Total Protein 6.5 - 8.1 g/dL 8.6(V5.7(L)  Total Bilirubin 0.3 - 1.2 mg/dL 1.1  Alkaline Phos 38 - 126 U/L 299(H)  AST 15 - 41 U/L 26  ALT 14 - 54 U/L 16    Discharge instruction: per After Visit Summary and "Baby and Me Booklet".  After Visit Meds:    Medication List    ASK your doctor about these medications        ferrous sulfate 325 (65 FE) MG tablet  Commonly known as:  FERROUSUL  Take 1 tablet (325 mg total) by mouth 2 (two) times daily.     PRENATAL VITAMIN PO  Take 1 tablet by mouth daily.     promethazine 25 MG tablet  Commonly known as:  PHENERGAN  Take 1 tablet (25 mg total) by mouth every 6 (six)  hours as needed for nausea or vomiting.        Diet: routine diet  Activity: Advance as tolerated. Pelvic rest for 6 weeks.   Outpatient follow up:6 weeks Follow up Appt:Future Appointments Date Time Provider Department Center  02/28/2016 1:40 PM Marlis EdelsonWalidah N Karim, CNM [facility]   Follow up visit: No Follow-up on file.  Postpartum contraception: Depo Provera  Newborn Data:   [name], GirlB Kiarra [[id number]][[id number]]  Live born female  Birth Weight: 5 lb 3.1 oz (2355 g) APGAR: 8, [address]   [name], GirlA Uriah [[id number]][[id number]]  Live born female  Birth Weight: 4 lb 15.4 oz (2251 g) APGAR: 6, 9  Baby Feeding: Bottle Disposition:home with mother   01/22/2016 Ferdie PingLAWSON, Elyan Vanwieren DARLENE, CNM

## 2016-01-23 ENCOUNTER — Ambulatory Visit: Payer: Self-pay

## 2016-01-23 NOTE — Lactation Note (Signed)
This note was copied from a baby's chart. Lactation Consultation Note; Mom reports baby B has just finished feeding for 20 min. Asleep at mom's side. Reports baby A has already fed for 20 min. Continues supplementing with formula after nursing. Reports both babies are getting better at nursing and she is feeling more comfortable with it. Suggested calling for assist with latch at next feeding. Unsure of what she is going to do about a pump for home. No questions at present. To call prn  Patient Name: Pamela HumphreyGirlB Pamela [name]: 01/23/2016 Reason for consult: Follow-up assessment;Late preterm infant;Infant < 6lbs   Maternal Data Formula Feeding for Exclusion: Yes Reason for exclusion: Mother's choice to formula and breast feed on admission Does the patient have breastfeeding experience prior to this delivery?: No  Feeding Feeding Type: Breast Fed Length of feed: 20 min  LATCH Score/Interventions                      Lactation Tools Discussed/Used     Consult Status Consult Status: Follow-up [name]: 01/24/16 Follow-up type: In-patient    [name], [name] 01/23/2016, 9:07 AM

## 2016-01-23 NOTE — Lactation Note (Signed)
This note was copied from a baby's chart. Lactation Consultation Note; Assisted mom with latching babies at the same time,. Baby A nursed on and off for about 7 min then going off to sleep. Reviewed keeping baby close to the breast throughout the feeding. Encouraged to feed at least q 3 hours or whenever they are showing feeding cues. No questions at present. Parents bottle feeding formula as I left room To call prn  Patient Name: Pamela [name] UJWJX'BToday's Date: 01/23/2016 Reason for consult: Follow-up assessment;Multiple gestation;Infant < 6lbs   Maternal Data Formula Feeding for Exclusion: Yes  Feeding Feeding Type: Breast Fed Length of feed: 7 min  LATCH Score/Interventions Latch: Repeated attempts needed to sustain latch, nipple held in mouth throughout feeding, stimulation needed to elicit sucking reflex.  Audible Swallowing: None  Type of Nipple: Everted at rest and after stimulation  Comfort (Breast/Nipple): Soft / non-tender     Hold (Positioning): Assistance needed to correctly position infant at breast and maintain latch. Intervention(s): Breastfeeding basics reviewed  LATCH Score: 6  Lactation Tools Discussed/Used     Consult Status Consult Status: Complete    [name], Eleen Litz D 01/23/2016, 1:45 PM

## 2016-01-23 NOTE — Lactation Note (Signed)
This note was copied from a baby's chart. Lactation Consultation Note; Assisted mom wtih latching both babies at the same time. Baby B was sleepy and only took a few sucks then going off to sleep. Parents bottle feeding formula as I left room. Encouraged to feed q 3 hours or whenever showing feeding cues. No questions at present. To call prn  Patient Name: Pamela [name] WUJWJ'XToday's Date: 01/23/2016 Reason for consult: Follow-up assessment;Multiple gestation;Infant < 6lbs   Maternal Data    Feeding Feeding Type: Breast Fed  LATCH Score/Interventions Latch: Too sleepy or reluctant, no latch achieved, no sucking elicited. (few sucks)  Audible Swallowing: None  Type of Nipple: Everted at rest and after stimulation  Comfort (Breast/Nipple): Soft / non-tender     Hold (Positioning): Assistance needed to correctly position infant at breast and maintain latch.  LATCH Score: 5  Lactation Tools Discussed/Used     Consult Status Consult Status: Complete    [name], [name] D 01/23/2016, 1:50 PM

## 2016-02-28 ENCOUNTER — Ambulatory Visit: Payer: Medicaid Other | Admitting: Family

## 2016-03-22 ENCOUNTER — Ambulatory Visit (INDEPENDENT_AMBULATORY_CARE_PROVIDER_SITE_OTHER): Payer: Medicaid Other | Admitting: Family Medicine

## 2016-03-22 ENCOUNTER — Encounter: Payer: Self-pay | Admitting: Family Medicine

## 2016-03-22 VITALS — BP 126/73 | HR 83 | Temp 97.7°F | Resp 17 | Ht 63.0 in

## 2016-03-22 DIAGNOSIS — Z3202 Encounter for pregnancy test, result negative: Secondary | ICD-10-CM | POA: Diagnosis not present

## 2016-03-22 DIAGNOSIS — O165 Unspecified maternal hypertension, complicating the puerperium: Secondary | ICD-10-CM | POA: Diagnosis present

## 2016-03-22 DIAGNOSIS — Z3042 Encounter for surveillance of injectable contraceptive: Secondary | ICD-10-CM | POA: Diagnosis not present

## 2016-03-22 LAB — POCT PREGNANCY, URINE: Preg Test, Ur: NEGATIVE

## 2016-03-22 MED ORDER — MEDROXYPROGESTERONE ACETATE 150 MG/ML IM SUSP
150.0000 mg | Freq: Once | INTRAMUSCULAR | Status: AC
  Administered 2016-03-22: 150 mg via INTRAMUSCULAR

## 2016-03-22 NOTE — Progress Notes (Signed)
  Subjective:     Pamela [name] is a 20 y.o. female who presents for a postpartum visit. She is 6 weeks postpartum following a spontaneous vaginal delivery. I have fully reviewed the prenatal and intrapartum course. The delivery was at 37 gestational weeks. Outcome: spontaneous vaginal delivery. Anesthesia: epidural. Postpartum course has been normal. Baby's course has been normal. Baby is feeding by bottle. Bleeding no bleeding. Bowel function is normal. Bladder function is normal. Patient is sexually active. Contraception method is none. Postpartum depression screening: negative.  The following portions of the patient's history were reviewed and updated as appropriate: allergies, current medications, past family history, past medical history, past social history, past surgical history and problem list.  Review of Systems Pertinent items noted in HPI and remainder of comprehensive ROS otherwise negative.   Objective:    BP 126/73 mmHg  Pulse 83  Temp(Src) 97.7 F (36.5 C) (Oral)  Resp 17  Ht 5\' 3"  (1.6 m)  LMP 03/14/2016  General:  alert, cooperative and no distress  Lungs: clear to auscultation bilaterally  Heart:  regular rate and rhythm, S1, S2 normal, no murmur, click, rub or gallop  Abdomen: soft, non-tender; bowel sounds normal; no masses,  no organomegaly        Assessment:     Normal postpartum exam. Hypertension.  Pap smear not done at today's visit.   Plan:    1. Contraception: Depo-Provera injections 2.  BP normal today. Patient didn't take her BP meds.  No need to take anymore. 3. Follow up in: 1 year or as needed.

## 2016-03-22 NOTE — Addendum Note (Signed)
Addended by: Rockwell GermanyFIELDS, Ashani Pumphrey A on: 03/22/2016 10:33 AM   Modules accepted: Orders

## 2016-06-24 ENCOUNTER — Ambulatory Visit: Payer: Medicaid Other | Admitting: Family Medicine

## 2017-01-28 IMAGING — US US MFM FETAL BPP W/O NON-STRESS
1 series · 12 of 28 positions shown · non-contrast
Comparison: none

[Series 1: us mfm fetal bpp w/o non-stress · 50 acquisitions, 12 frames shown]
[im 2/50]
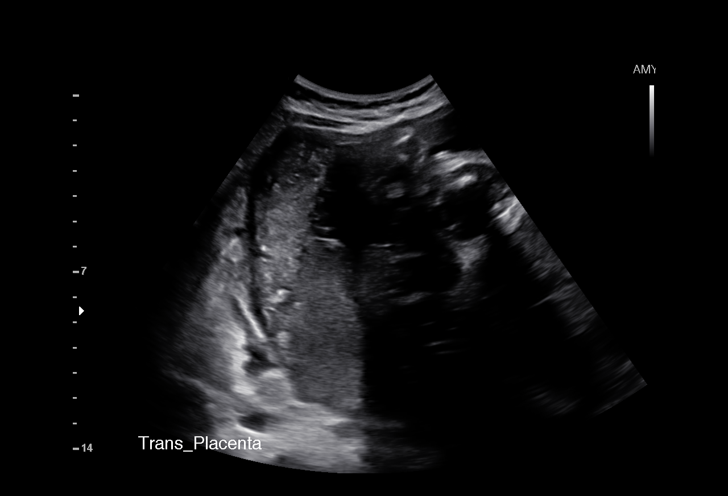
[im 6/50]
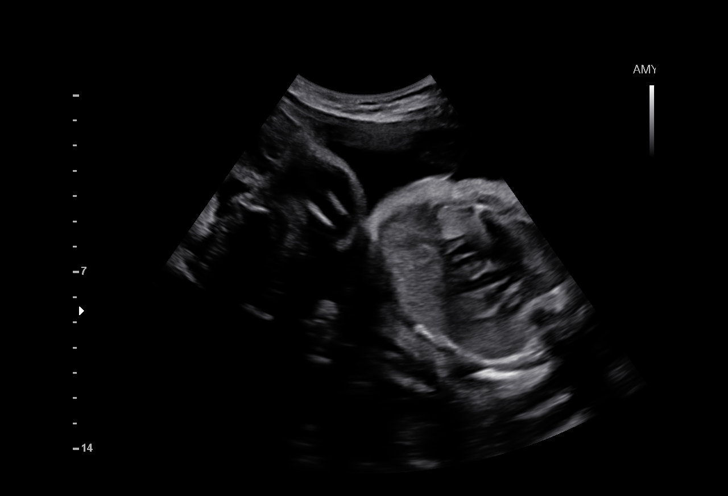
[im 10/50]
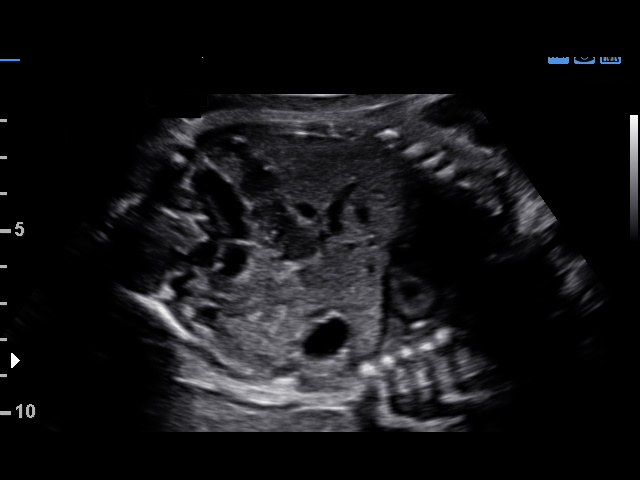
[im 15/50]
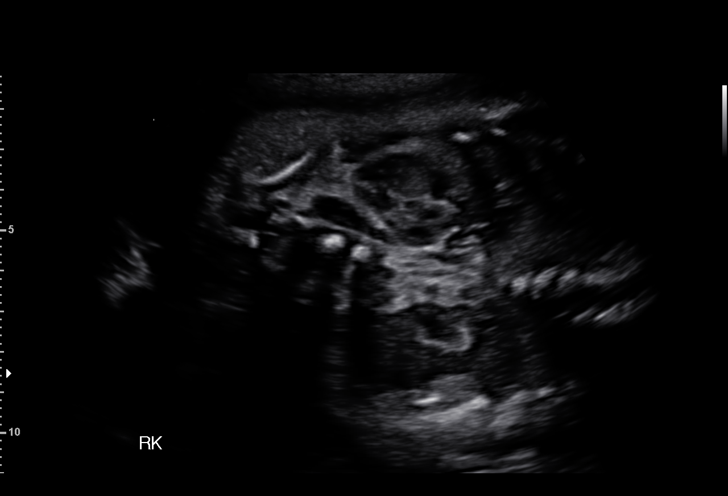
[im 19/50]
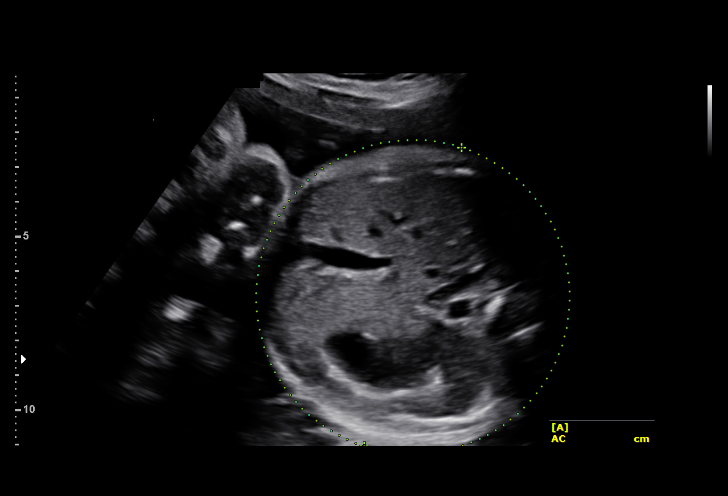
[im 22/50]
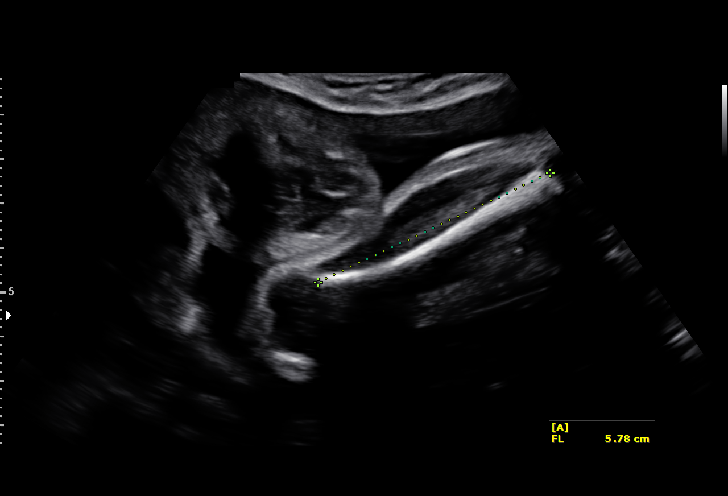
[im 28/50]
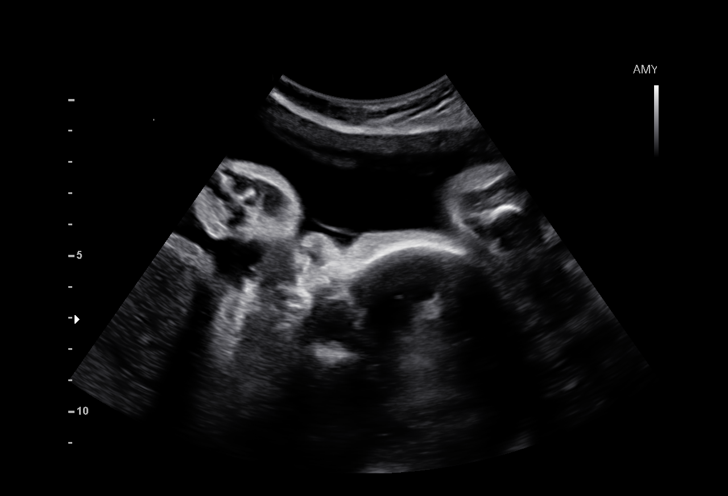
[im 31/50]
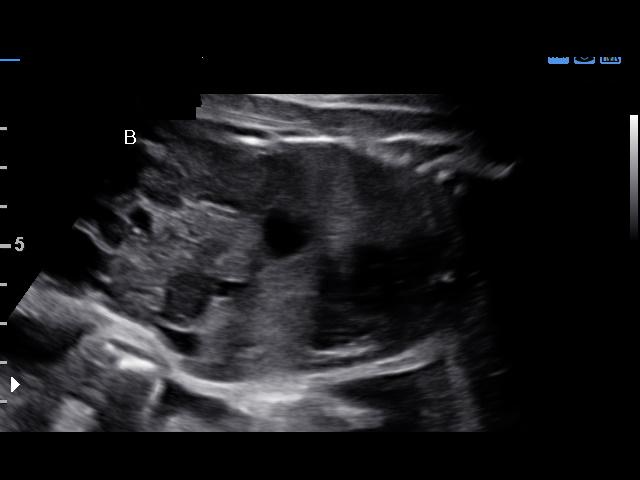
[im 35/50]
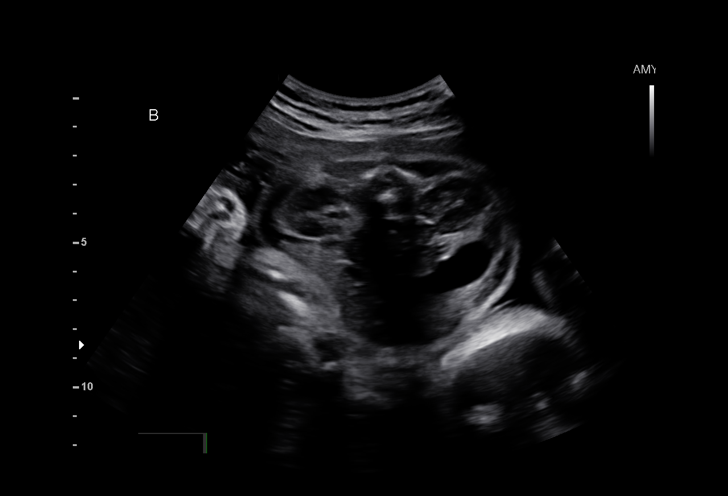
[im 40/50]
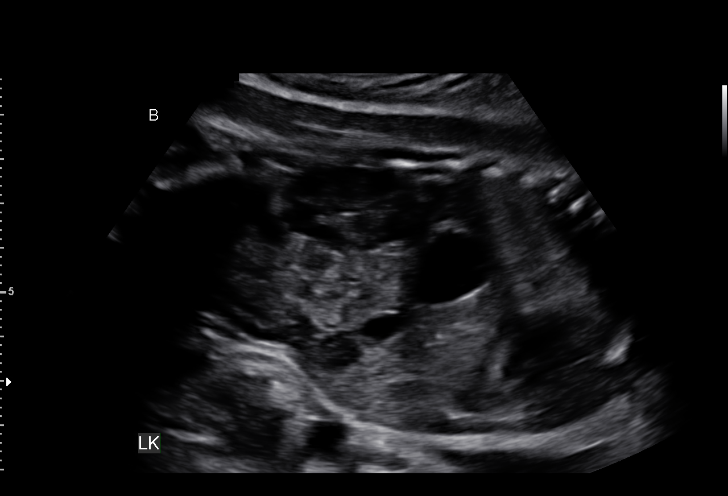
[im 44/50]
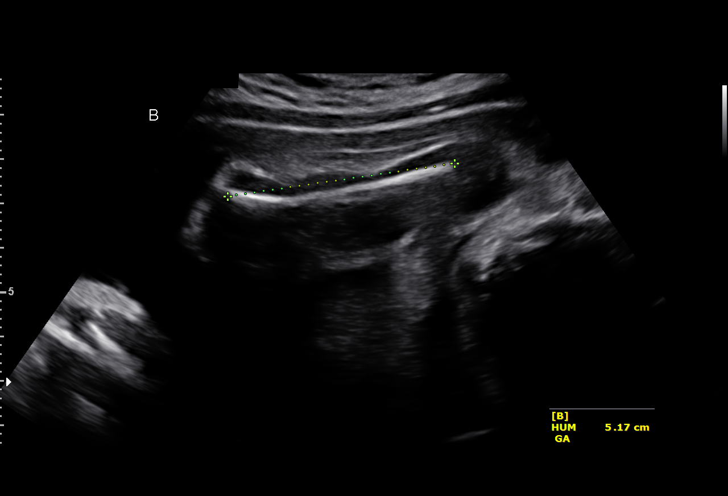
[im 48/50]
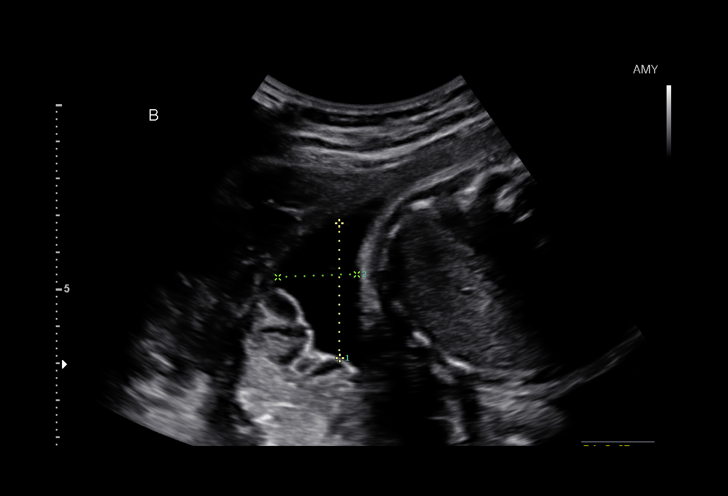

[12 of 28 positions shown; findings below may reference images not displayed]

pm)

Date:

Faculty Physician
[facility]
[REDACTED]-
Faculty Physician
[facility]

GESTATION

1  UCHON PARTHALAR ARDALISA            [id number]       [id number]     [id number]
2  UCHON PARTHALAR ARDALISA            [id number]       [id number]     [id number]
3  DAX GONE           [id number]       [id number]     [id number]
4  DAX GONE           [id number]       [id number]     [id number]
Indications

Twin pregnancy, Onlain Magazia/Dibal, third trimester
Maternal care for known or suspected poor
fetal growth, third trimester, fetus 2
32 weeks gestation of pregnancy
OB History

Height:        5'3"   Weight:   137        BMI:
Gravidity:     1         Term:  0        Prem:    0        SAB:   0
TOP:           0       Ectopic  0        Living:  0
:
Fetal Evaluation (Fetus A)

Num Of Fetuses:      2
Fetal Heart          158
Rate(bpm):
Cardiac Activity:    Observed
Fetal Lie:           Left Fetus
Presentation:        Cephalic
Placenta:            Posterior, above cervical os
P. Cord Insertion:   Previously Visualized
Membrane Desc:       Dividing Membrane seen - Monochorionic

Amniotic Fluid
AFI FV:      Subjectively within normal limits
Larg Pckt:      5.3  cm
Biophysical Evaluation (Fetus A)

Amniotic F.V:   Within normal limits        F. Tone:        Observed
F. Movement:    Observed                    Score:          [DATE]
F. Breathing:   Observed
Biometry (Fetus A)

BPD:      75.3  mm     G. Age:   30w 2d                  CI:         68.7   %    70 - 86
FL/HC:      19.8   %    19.1 -
HC:      290.3  mm     G. Age:   32w 0d        10   %    HC/AC:      1.02        0.96 -
AC:      284.5  mm     G. Age:   32w 3d        55   %    FL/BPD      76.2   %    71 - 87
:
FL:       57.4  mm     G. Age:   30w 0d       < 3   %    FL/AC:      20.2   %    20 - 24
HUM:      51.9  mm     G. Age:   30w 2d        12   %

Est.        4104   gm   3 lb 15 oz      42   %    FW Discordancy     0 \ 11  %
FW:
Gestational Age (Fetus A)

LMP:           33w 0d        Date:  05/01/15                  EDD:   02/05/16
U/S Today:     31w 1d                                         EDD:   02/18/16
Best:          32w 2d    Det. By:   U/S C R L Fetus B         EDD:   02/10/16
(08/07/15)
Anatomy (Fetus A)

Cranium:          Previously seen        Aortic Arch:       Previously seen
Fetal Cavum:      Appears normal         Ductal Arch:       Previously seen
Ventricles:       Appears normal         Diaphragm:         Appears normal
Choroid Plexus:   Previously seen        Stomach:           Appears normal,
left sided
Cerebellum:       Previously seen        Abdomen:           Previously seen
Posterior         Previously seen        Abdominal          Previously seen
Fossa:                                   Wall:
Nuchal Fold:      Previously seen        Cord Vessels:      Previously seen
Face:             Orbits and profile     Kidneys:           Appear normal
previously seen
Lips:             Previously seen        Bladder:           Appears normal
Fetal Thoracic:   Appears normal         Spine:             Previously seen
Heart:            Previously seen        Upper              Previously seen
Extremities:
RVOT:             Previously seen        Lower              Previously seen
Extremities:
LVOT:             Previously seen

Other:   Female gender previously seen. Heels and 5th digit previously seen.

Fetal Evaluation (Fetus B)

Num Of Fetuses:      2
Fetal Heart          161
Rate(bpm):
Cardiac Activity:    Observed
Fetal Lie:           Right Fetus
Presentation:        Cephalic
Placenta:            Posterior, above cervical os
P. Cord Insertion:   Previously Visualized
Membrane Desc:       Dividing Membrane seen - Monochorionic

Amniotic Fluid
AFI FV:      Subjectively within normal limits
Larg Pckt:      3.7  cm
Biophysical Evaluation (Fetus B)

Amniotic F.V:   Within normal limits        F. Tone:        Observed
F. Movement:    Observed                    Score:          [DATE]
F. Breathing:   Observed
Biometry (Fetus B)

BPD:      77.5  mm     G. Age:   31w 1d                  CI:        78.24   %    70 - 86
FL/HC:      20.4   %    19.1 -
HC:      277.2  mm     G. Age:   30w 2d       < 3   %    HC/AC:      1.03        0.96 -
AC:      268.5  mm     G. Age:   31w 0d        15   %    FL/BPD      72.9   %    71 - 87
:
FL:       56.5  mm     G. Age:   29w 5d       < 3   %    FL/AC:      21.0   %    20 - 24
HUM:      51.7  mm     G. Age:   30w 1d        10   %

Est.        7797   gm    3 lb 8 oz      23   %    FW Discordancy         11  %
FW:
Gestational Age (Fetus B)

LMP:           33w 0d        Date:  05/01/15                  EDD:   02/05/16
U/S Today:     30w 4d                                         EDD:   02/22/16
Best:          32w 2d    Det. By:   U/S C R L Fetus B         EDD:   02/10/16
(08/07/15)
Anatomy (Fetus B)

Cranium:          Previously seen        Aortic Arch:       Previously seen
Fetal Cavum:      Previously seen        Ductal Arch:       Previously seen
Ventricles:       Appears normal         Diaphragm:         Appears normal
Choroid Plexus:   Previously seen        Stomach:           Appears normal,
left sided
Cerebellum:       Previously seen        Abdomen:           Previously seen
Posterior         Previously seen        Abdominal          Previously seen
Fossa:                                   Wall:
Nuchal Fold:      Previously seen        Cord Vessels:      Previously seen
Face:             Orbits and profile     Kidneys:           Appear normal
previously seen
Lips:             Previously seen        Bladder:           Appears normal
Fetal Thoracic:   Appears normal         Spine:             Previously seen
Heart:            Previously seen        Upper              Previously seen
Extremities:
RVOT:             Previously seen        Lower              Previously seen
Extremities:
LVOT:             Previously seen

Other:   Female gender previously seen. Heels previously seen.
Cervix Uterus Adnexa

Cervix
Not visualized (advanced GA >69wks)
Impression

Monochorionic/diamniotic twin pregnancy at 32+2 weeks
Cephalic/cephalic presentation
Normal interval anatomy; anatomic survey complete x 2
Normal amniotic fluid volume x 2
Appropriate interval growths:
Twin A - EFW at the 42nd %tile;
Twin B - EFW at the 23rd %tile, AC at the 15th %tile

---------------------------------------------------------------------- Recommendations

Follow-up ultrasound for growth in 3 weeks

## 2018-01-15 ENCOUNTER — Other Ambulatory Visit: Payer: Self-pay

## 2018-01-15 ENCOUNTER — Encounter (HOSPITAL_COMMUNITY): Payer: Self-pay | Admitting: Emergency Medicine

## 2018-01-15 ENCOUNTER — Ambulatory Visit (HOSPITAL_COMMUNITY)
Admission: EM | Admit: 2018-01-15 | Discharge: 2018-01-15 | Disposition: A | Discharge status: Home / Self Care | Payer: Medicaid Other | Attending: Family Medicine | Admitting: Family Medicine

## 2018-01-15 DIAGNOSIS — K529 Noninfective gastroenteritis and colitis, unspecified: Secondary | ICD-10-CM

## 2018-01-15 MED ORDER — ONDANSETRON 4 MG PO TBDP: 4.0000 mg | ORAL_TABLET | Freq: Three times a day (TID) | ORAL | 0 refills | Status: DC | PRN

## 2018-01-15 NOTE — ED Provider Notes (Signed)
[facility]   [id number] 01/15/18 Arrival Time: 1500  ASSESSMENT & PLAN:  1. Gastroenteritis     Meds ordered this encounter  Medications  . ondansetron (ZOFRAN-ODT) 4 MG disintegrating tablet    Sig: Take 1 tablet (4 mg total) by mouth every 8 (eight) hours as needed for nausea or vomiting.    Dispense:  15 tablet    Refill:  0   Work note given. Discussed typical duration of symptoms for suspected viral GI illness. Will do her best to ensure adequate fluid intake in order to avoid dehydration. Will proceed to the Emergency Department for evaluation if unable to tolerate PO fluids regularly.  Otherwise she will f/u with his PCP or here if not showing improvement over the next 48-72 hours.  Reviewed expectations re: course of current medical issues. Questions answered. Outlined signs and symptoms indicating need for more acute intervention. Patient verbalized understanding. After Visit Summary given.   SUBJECTIVE: History from: patient.  Pamela [name] is a 22 y.o. female who presents with complaint of non-bloody intermittent nausea and vomiting of brown material with non-bloody diarrhea. Onset abrupt, 2 days ago. Abdominal discomfort: mild and cramping. Symptoms are gradually improving since beginning. Aggravating factors: eating. Alleviating factors: none. Associated symptoms: fatigue. She denies chills and fever. Appetite: decreased. PO intake: decreased. Ambulatory without assistance. Urinary symptoms: none. OTC treatment: none.  No LMP recorded. Patient has had an injection.  Past Surgical History:  Procedure Laterality Date  . NO PAST SURGERIES     ROS: As per HPI.  OBJECTIVE:  Vitals:   01/15/18 1552  BP: 124/70  Pulse: 85  Resp: 18  Temp: 98.3 F (36.8 C)  TempSrc: Oral  SpO2: 100%    General appearance: alert; no distress Oropharynx: moist Lungs: clear to auscultation bilaterally Heart: regular rate and rhythm Abdomen: soft;  non-distended; no significant abdominal tenderness, cramping; bowel sounds present; no masses or organomegaly; no guarding or rebound tenderness Back: no CVA tenderness Extremities: no edema; symmetrical with no gross deformities Skin: warm and dry Neurologic: normal gait Psychological: alert and cooperative; normal mood and affect  No Known Allergies                                             Past Medical History:  Diagnosis Date  . Medical history non-contributory    Social History   Socioeconomic History  . Marital status: Single    Spouse name: Not on file  . Number of children: Not on file  . Years of education: Not on file  . Highest education level: Not on file  Social Needs  . Financial resource strain: Not on file  . Food insecurity - worry: Not on file  . Food insecurity - inability: Not on file  . Transportation needs - medical: Not on file  . Transportation needs - non-medical: Not on file  Occupational History  . Not on file  Tobacco Use  . Smoking status: Never Smoker  Substance and Sexual Activity  . Alcohol use: No  . Drug use: No  . Sexual activity: Yes    Birth control/protection: None, Injection  Other Topics Concern  . Not on file  Social History Narrative  . Not on file   Family History  Problem Relation Age of Onset  . Diabetes Mother   . Asthma Sister  Mardella LaymanHagler, Adaora Mchaney, MD 01/15/18 [phone]

## 2018-01-15 NOTE — ED Triage Notes (Signed)
Emesis x 2 days.  Ate chicken alfredo from [employer] last night and vomited this morning.

## 2018-07-17 ENCOUNTER — Encounter (HOSPITAL_COMMUNITY): Payer: Self-pay | Admitting: Emergency Medicine

## 2018-07-17 ENCOUNTER — Ambulatory Visit (HOSPITAL_COMMUNITY)
Admission: EM | Admit: 2018-07-17 | Discharge: 2018-07-17 | Disposition: A | Discharge status: Home / Self Care | Payer: Medicaid Other | Attending: Family Medicine | Admitting: Family Medicine

## 2018-07-17 DIAGNOSIS — R5383 Other fatigue: Secondary | ICD-10-CM

## 2018-07-17 DIAGNOSIS — G44209 Tension-type headache, unspecified, not intractable: Secondary | ICD-10-CM | POA: Diagnosis not present

## 2018-07-17 DIAGNOSIS — R110 Nausea: Secondary | ICD-10-CM

## 2018-07-17 LAB — POCT I-STAT, CHEM 8
BUN: 6 mg/dL (ref 6–20)
Calcium, Ion: 1.22 mmol/L (ref 1.15–1.40)
Chloride: 103 mmol/L (ref 98–111)
Creatinine, Ser: 0.6 mg/dL (ref 0.44–1.00)
GLUCOSE: 94 mg/dL (ref 70–99)
HCT: 38 % (ref 36.0–46.0)
HEMOGLOBIN: 12.9 g/dL (ref 12.0–15.0)
POTASSIUM: 4 mmol/L (ref 3.5–5.1)
SODIUM: 139 mmol/L (ref 135–145)
TCO2: 25 mmol/L (ref 22–32)

## 2018-07-17 LAB — POCT PREGNANCY, URINE: PREG TEST UR: NEGATIVE

## 2018-07-17 MED ORDER — KETOROLAC TROMETHAMINE 30 MG/ML IJ SOLN: 30.0000 mg | Freq: Once | INTRAMUSCULAR | Status: DC

## 2018-07-17 MED ORDER — ONDANSETRON HCL 4 MG PO TABS: 4.0000 mg | ORAL_TABLET | Freq: Four times a day (QID) | ORAL | 0 refills | Status: DC

## 2018-07-17 MED ORDER — NAPROXEN 500 MG PO TABS: 500.0000 mg | ORAL_TABLET | Freq: Two times a day (BID) | ORAL | 0 refills | Status: AC

## 2018-07-17 MED ORDER — KETOROLAC TROMETHAMINE 30 MG/ML IJ SOLN
INTRAMUSCULAR | Status: AC
  Filled 2018-07-17: qty 1

## 2018-07-17 NOTE — Discharge Instructions (Addendum)
It was nice meeting you!!  Your urine was negative for pregnancy. Your blood work did not show any anemia. We will treat you here in clinic with Toradol for your headache. I will send you home with some prescriptions for nausea and headache. Follow up as needed for continued or worsening symptoms

## 2018-07-17 NOTE — ED Triage Notes (Signed)
Pt here for nausea and HA; pt unsure if from htn

## 2018-07-19 NOTE — ED Provider Notes (Signed)
[facility]    CSN: [id number] Arrival date & time: 07/17/18  1146     History   Chief Complaint Chief Complaint  Patient presents with  . Nausea    HPI Pamela [name] is a 22 y.o. female.   Pt is a 22 year old female with PMH of anemia here for  fatigue, nausea dry mouth and headaches over the last 3 days. Symptoms have been constant and remained the same. Family hx of anemia. LMP this past Tuesday it ended and was normal. She is currently sexually active. Stopped her birth control in march.  No vomiting or diarrhea. No abdominal pain. Denies vaginal or urinary symptoms.      Past Medical History:  Diagnosis Date  . Medical history non-contributory     Patient Active Problem List   Diagnosis Date Noted  . Anemia of mother in pregnancy, antepartum 01/05/2016  . Pica 12/14/2015  . Susceptible to varicella (non-immune), currently pregnant 10/31/2015    Past Surgical History:  Procedure Laterality Date  . NO PAST SURGERIES      OB History    Gravida  1   Para  1   Term  1   Preterm      AB      Living  2     SAB      TAB      Ectopic      Multiple  1   Live Births  2            Home Medications    Prior to Admission medications   Medication Sig Start Date End Date Taking? Authorizing Provider  ferrous sulfate (FERROUSUL) 325 (65 FE) MG tablet Take 1 tablet (325 mg total) by mouth 2 (two) times daily. 01/02/16   Wouk, Wilfred Curtis, MD  ibuprofen (ADVIL,MOTRIN) 600 MG tablet Take 1 tablet (600 mg total) by mouth every 6 (six) hours. 01/22/16   Montez Morita, CNM  naproxen (NAPROSYN) 500 MG tablet Take 1 tablet (500 mg total) by mouth 2 (two) times daily. 07/17/18   Aarush Stukey, Gloris Manchester A, NP  ondansetron (ZOFRAN) 4 MG tablet Take 1 tablet (4 mg total) by mouth every 6 (six) hours. 07/17/18   Ladarian Bonczek, Gloris Manchester A, NP  ondansetron (ZOFRAN-ODT) 4 MG disintegrating tablet Take 1 tablet (4 mg total) by mouth every 8 (eight) hours as needed for nausea or  vomiting. 01/15/18   Mardella Layman, MD  Prenatal Vit-Fe Fumarate-FA (PRENATAL VITAMIN PO) Take 1 tablet by mouth daily.     [provider]    Family History Family History  Problem Relation Age of Onset  . Diabetes Mother   . Asthma Sister     Social History Social History   Tobacco Use  . Smoking status: Never Smoker  Substance Use Topics  . Alcohol use: No  . Drug use: No     Allergies   Patient has no known allergies.   Review of Systems Review of Systems   Physical Exam Triage Vital Signs ED Triage Vitals [07/17/18 1200]  Enc Vitals Group     BP 131/74     Pulse Rate 73     Resp 18     Temp 98.4 F (36.9 C)     Temp Source Oral     SpO2 98 %     Weight      Height      Head Circumference      Peak Flow  Pain Score      Pain Loc      Pain Edu?      Excl. in GC?    No data found.  Updated Vital Signs BP 131/74 (BP Location: Right Arm)   Pulse 73   Temp 98.4 F (36.9 C) (Oral)   Resp 18   SpO2 98%   Visual Acuity Right Eye Distance:   Left Eye Distance:   Bilateral Distance:    Right Eye Near:   Left Eye Near:    Bilateral Near:     Physical Exam  Constitutional: She is oriented to person, place, and time. She appears well-developed and well-nourished.  Very pleasant. Non toxic or ill appearing.    HENT:  Head: Normocephalic and atraumatic.  Eyes: Conjunctivae are normal.  Neck: Normal range of motion.  Pulmonary/Chest: Effort normal.  Abdominal: Soft. Bowel sounds are normal.  Abdomen soft, non tender. No masses or rebound tenderness.   Musculoskeletal: Normal range of motion.  Neurological: She is alert and oriented to person, place, and time.  Skin: Skin is warm and dry.  Psychiatric: She has a normal mood and affect.  Nursing note and vitals reviewed.    UC Treatments / Results  Labs (all labs ordered are listed, but only abnormal results are displayed) Labs Reviewed  POCT PREGNANCY, URINE  POCT I-STAT, CHEM  8    EKG None  Radiology No results found.  Procedures Procedures (including critical care time)  Medications Ordered in UC Medications - No data to display  Initial Impression / Assessment and Plan / UC Course  I have reviewed the triage vital signs and the nursing notes.  Pertinent labs & imaging results that were available during my care of the patient were reviewed by me and considered in my medical decision making (see chart for details).    She concerned about hypertension.  Normotensive in clinic Urine and pregnancy negative. I-STAT Chem-8 normal.  No anemia Most likely viral illness Toradol in clinic. Scription for Zofran sent home. Follow up as needed for continued or worsening symptoms  Final Clinical Impressions(s) / UC Diagnoses   Final diagnoses:  Nausea  Fatigue, unspecified type  Acute non intractable tension-type headache     Discharge Instructions     It was nice meeting you!!  Your urine was negative for pregnancy. Your blood work did not show any anemia. We will treat you here in clinic with Toradol for your headache. I will send you home with some prescriptions for nausea and headache. Follow up as needed for continued or worsening symptoms     ED Prescriptions    Medication Sig Dispense Auth. Provider   ondansetron (ZOFRAN) 4 MG tablet Take 1 tablet (4 mg total) by mouth every 6 (six) hours. 12 tablet Chijioke Lasser A, NP   naproxen (NAPROSYN) 500 MG tablet Take 1 tablet (500 mg total) by mouth 2 (two) times daily. 30 tablet Dahlia Byes A, NP     Controlled Substance Prescriptions [place] Controlled Substance Registry consulted? No   Janace Aris, NP 07/20/18 [phone]

## 2020-01-15 ENCOUNTER — Other Ambulatory Visit: Payer: Self-pay

## 2020-01-15 ENCOUNTER — Ambulatory Visit (HOSPITAL_COMMUNITY)
Admission: EM | Admit: 2020-01-15 | Discharge: 2020-01-15 | Disposition: A | Discharge status: Home / Self Care | Payer: HRSA Program | Attending: Emergency Medicine | Admitting: Emergency Medicine

## 2020-01-15 DIAGNOSIS — R112 Nausea with vomiting, unspecified: Secondary | ICD-10-CM

## 2020-01-15 DIAGNOSIS — Z79899 Other long term (current) drug therapy: Secondary | ICD-10-CM | POA: Diagnosis not present

## 2020-01-15 DIAGNOSIS — Z20822 Contact with and (suspected) exposure to covid-19: Secondary | ICD-10-CM | POA: Diagnosis not present

## 2020-01-15 DIAGNOSIS — R1084 Generalized abdominal pain: Secondary | ICD-10-CM | POA: Diagnosis not present

## 2020-01-15 MED ORDER — ONDANSETRON 4 MG PO TBDP
ORAL_TABLET | ORAL | Status: AC
  Filled 2020-01-15: qty 1

## 2020-01-15 MED ORDER — ONDANSETRON 4 MG PO TBDP: 4.0000 mg | ORAL_TABLET | Freq: Three times a day (TID) | ORAL | 0 refills | Status: DC | PRN

## 2020-01-15 MED ORDER — ONDANSETRON 4 MG PO TBDP
4.0000 mg | ORAL_TABLET | Freq: Once | ORAL | Status: AC
  Administered 2020-01-15: 4 mg via ORAL

## 2020-01-15 NOTE — ED Provider Notes (Signed)
[facility]    CSN: [id number] Arrival date & time: 01/15/20  1010      History   Chief Complaint Chief Complaint  Patient presents with  . Emesis    HPI Pamela [name] is a 24 y.o. female history of anemia presenting today for evaluation of nausea and vomiting.  Patient reports that she was drinking alcohol last night and around 12 AM began to have significant nausea and vomiting which is present persistent throughout the night.  She has had associated abdominal pain and discomfort associated with this vomiting.  Described as soreness and tightness.  Does report some mild looser stools.  Denies associated URI symptoms.  Denies chest pain or shortness of breath.  Reports that she had a few shots along with 2 margaritas.  States that this is a typical amount of alcohol that she can typically tolerate.  Denies any close sick contacts with similar symptoms.  Denies any known exposure to Covid.  Patient is currently on her menstrual cycle.  HPI  Past Medical History:  Diagnosis Date  . Medical history non-contributory     Patient Active Problem List   Diagnosis Date Noted  . Anemia of mother in pregnancy, antepartum 01/05/2016  . Pica 12/14/2015  . Susceptible to varicella (non-immune), currently pregnant 10/31/2015    Past Surgical History:  Procedure Laterality Date  . NO PAST SURGERIES      OB History    Gravida  1   Para  1   Term  1   Preterm      AB      Living  2     SAB      TAB      Ectopic      Multiple  1   Live Births  2            Home Medications    Prior to Admission medications   Medication Sig Start Date End Date Taking? Authorizing Provider  ferrous sulfate (FERROUSUL) 325 (65 FE) MG tablet Take 1 tablet (325 mg total) by mouth 2 (two) times daily. 01/02/16   Wouk, Ailene Rud, MD  ibuprofen (ADVIL,MOTRIN) 600 MG tablet Take 1 tablet (600 mg total) by mouth every 6 (six) hours. 01/22/16   Keitha Butte, CNM    naproxen (NAPROSYN) 500 MG tablet Take 1 tablet (500 mg total) by mouth 2 (two) times daily. 07/17/18   Bast, Tressia Miners A, NP  ondansetron (ZOFRAN ODT) 4 MG disintegrating tablet Take 1 tablet (4 mg total) by mouth every 8 (eight) hours as needed for nausea or vomiting. 01/15/20   Jashay Roddy C, PA-C  Prenatal Vit-Fe Fumarate-FA (PRENATAL VITAMIN PO) Take 1 tablet by mouth daily.     [provider]    Family History Family History  Problem Relation Age of Onset  . Diabetes Mother   . Asthma Sister     Social History Social History   Tobacco Use  . Smoking status: Never Smoker  Substance Use Topics  . Alcohol use: No  . Drug use: No     Allergies   Patient has no known allergies.   Review of Systems Review of Systems  Constitutional: Positive for chills, fatigue and fever. Negative for activity change and appetite change.  HENT: Negative for congestion, ear pain, rhinorrhea, sinus pressure, sore throat and trouble swallowing.   Eyes: Negative for discharge and redness.  Respiratory: Negative for cough, chest tightness and shortness of breath.   Cardiovascular:  Negative for chest pain.  Gastrointestinal: Positive for abdominal pain, nausea and vomiting. Negative for diarrhea.  Musculoskeletal: Negative for myalgias.  Skin: Negative for rash.  Neurological: Negative for dizziness, light-headedness and headaches.     Physical Exam Triage Vital Signs ED Triage Vitals  Enc Vitals Group     BP 01/15/20 1049 131/78     Pulse Rate 01/15/20 1049 92     Resp 01/15/20 1049 18     Temp 01/15/20 1049 98.3 F (36.8 C)     Temp Source 01/15/20 1049 Oral     SpO2 01/15/20 1049 100 %     Weight --      Height --      Head Circumference --      Peak Flow --      Pain Score 01/15/20 1050 10     Pain Loc --      Pain Edu? --      Excl. in GC? --    No data found.  Updated Vital Signs BP 131/78 (BP Location: Right Arm)   Pulse 92   Temp 98.3 F (36.8 C) (Oral)    Resp 18   SpO2 100%   Visual Acuity Right Eye Distance:   Left Eye Distance:   Bilateral Distance:    Right Eye Near:   Left Eye Near:    Bilateral Near:     Physical Exam Vitals and nursing note reviewed.  Constitutional:      Appearance: She is well-developed.     Comments: No acute distress  HENT:     Head: Normocephalic and atraumatic.     Nose: Nose normal.  Eyes:     Conjunctiva/sclera: Conjunctivae normal.  Cardiovascular:     Rate and Rhythm: Normal rate.  Pulmonary:     Effort: Pulmonary effort is normal. No respiratory distress.     Comments: Breathing comfortably at rest, CTABL, no wheezing, rales or other adventitious sounds auscultated  Abdominal:     General: There is no distension.     Comments: Soft, nondistended, generalized tenderness throughout entire abdomen, more focal towards epigastrium, negative rebound, negative Murphy's, negative McBurney's, negative Rovsing  Musculoskeletal:        General: Normal range of motion.     Cervical back: Neck supple.  Skin:    General: Skin is warm and dry.  Neurological:     Mental Status: She is alert and oriented to person, place, and time.      UC Treatments / Results  Labs (all labs ordered are listed, but only abnormal results are displayed) Labs Reviewed  NOVEL CORONAVIRUS, NAA (HOSP ORDER, SEND-OUT TO REF LAB; TAT 18-24 HRS)    EKG   Radiology No results found.  Procedures Procedures (including critical care time)  Medications Ordered in UC Medications  ondansetron (ZOFRAN-ODT) disintegrating tablet 4 mg (4 mg Oral Given 01/15/20 1057)    Initial Impression / Assessment and Plan / UC Course  I have reviewed the triage vital signs and the nursing notes.  Pertinent labs & imaging results that were available during my care of the patient were reviewed by me and considered in my medical decision making (see chart for details).     Covid PCR pending to rule out Covid.  Most likely alcoholic  gastroenteritis causing nausea and vomiting.  Provided Zofran 4 mg ODT in clinic with fluid challenge.  Patient had significant improvement with Zofran, tolerating small sips of fluids.  Will send home with Zofran to continue  to use at home and oral rehydration.  Continue to monitor abdominal pain to resolve with easing of nausea/vomiting.  Do not suspect abdominal emergency at this time, but advised patient to follow-up if developing increased or worsening abdominal pain or is developing associated chest pain with vomiting.  Discussed strict return precautions. Patient verbalized understanding and is agreeable with plan.    Final Clinical Impressions(s) / UC Diagnoses   Final diagnoses:  Non-intractable vomiting with nausea, unspecified vomiting type  Generalized abdominal pain     Discharge Instructions     Please use Zofran dissolved in mouth around the clock for the next 24 to 48 hours then go to as needed for nausea Rest and focus on fluids, slowly transition to solids as nausea improving and liquids staying down.  Please follow-up if developing worsening abdominal pain, lightheadedness, dizziness, unable to keep fluids down despite use of Zofran    ED Prescriptions    Medication Sig Dispense Auth. Provider   ondansetron (ZOFRAN ODT) 4 MG disintegrating tablet Take 1 tablet (4 mg total) by mouth every 8 (eight) hours as needed for nausea or vomiting. 24 tablet Orren Pietsch, [place] C, PA-C     PDMP not reviewed this encounter.   Lew Dawes, [place] 01/15/20 1136

## 2020-01-15 NOTE — ED Triage Notes (Signed)
Pt present last night she has some drinks and around 12am she begin to vomit non stop. Pt states that she believe she was alcohol poisoning.

## 2020-01-15 NOTE — Discharge Instructions (Signed)
Please use Zofran dissolved in mouth around the clock for the next 24 to 48 hours then go to as needed for nausea Rest and focus on fluids, slowly transition to solids as nausea improving and liquids staying down.  Please follow-up if developing worsening abdominal pain, lightheadedness, dizziness, unable to keep fluids down despite use of Zofran

## 2020-01-16 LAB — NOVEL CORONAVIRUS, NAA (HOSP ORDER, SEND-OUT TO REF LAB; TAT 18-24 HRS): SARS-CoV-2, NAA: NOT DETECTED

## 2020-07-08 ENCOUNTER — Emergency Department (HOSPITAL_COMMUNITY)
Admission: EM | Admit: 2020-07-08 | Discharge: 2020-07-08 | Disposition: A | Discharge status: Home / Self Care | Payer: Self-pay | Attending: Emergency Medicine | Admitting: Emergency Medicine

## 2020-07-08 ENCOUNTER — Encounter (HOSPITAL_COMMUNITY): Payer: Self-pay | Admitting: Emergency Medicine

## 2020-07-08 ENCOUNTER — Emergency Department (HOSPITAL_COMMUNITY): Payer: Self-pay

## 2020-07-08 DIAGNOSIS — R6889 Other general symptoms and signs: Secondary | ICD-10-CM

## 2020-07-08 DIAGNOSIS — Z20822 Contact with and (suspected) exposure to covid-19: Secondary | ICD-10-CM

## 2020-07-08 DIAGNOSIS — U071 COVID-19: Secondary | ICD-10-CM | POA: Insufficient documentation

## 2020-07-08 DIAGNOSIS — F17200 Nicotine dependence, unspecified, uncomplicated: Secondary | ICD-10-CM | POA: Insufficient documentation

## 2020-07-08 DIAGNOSIS — Z79899 Other long term (current) drug therapy: Secondary | ICD-10-CM | POA: Insufficient documentation

## 2020-07-08 LAB — URINALYSIS, ROUTINE W REFLEX MICROSCOPIC
Bacteria, UA: NONE SEEN
Bilirubin Urine: NEGATIVE
Glucose, UA: NEGATIVE mg/dL
Hgb urine dipstick: NEGATIVE
Ketones, ur: 20 mg/dL — AB
Leukocytes,Ua: NEGATIVE
Nitrite: NEGATIVE
Protein, ur: 30 mg/dL — AB
Specific Gravity, Urine: 1.029 (ref 1.005–1.030)
pH: 5 (ref 5.0–8.0)

## 2020-07-08 LAB — COMPREHENSIVE METABOLIC PANEL
ALT: 19 U/L (ref 0–44)
AST: 20 U/L (ref 15–41)
Albumin: 4 g/dL (ref 3.5–5.0)
Alkaline Phosphatase: 54 U/L (ref 38–126)
Anion gap: 11 (ref 5–15)
BUN: 7 mg/dL (ref 6–20)
CO2: 24 mmol/L (ref 22–32)
Calcium: 9.3 mg/dL (ref 8.9–10.3)
Chloride: 103 mmol/L (ref 98–111)
Creatinine, Ser: 0.74 mg/dL (ref 0.44–1.00)
GFR calc Af Amer: >60 mL/min (ref >60)
GFR calc non Af Amer: >60 mL/min (ref >60)
Glucose, Bld: 121 mg/dL — ABNORMAL HIGH (ref 70–99)
Potassium: 3.6 mmol/L (ref 3.5–5.1)
Sodium: 138 mmol/L (ref 135–145)
Total Bilirubin: 0.7 mg/dL (ref 0.3–1.2)
Total Protein: 8.1 g/dL (ref 6.5–8.1)

## 2020-07-08 LAB — CBC
HCT: 38.6 % (ref 36.0–46.0)
Hemoglobin: 11.8 g/dL — ABNORMAL LOW (ref 12.0–15.0)
MCH: 25.5 pg — ABNORMAL LOW (ref 26.0–34.0)
MCHC: 30.6 g/dL (ref 30.0–36.0)
MCV: 83.5 fL (ref 80.0–100.0)
Platelets: 313 10*3/uL (ref 150–400)
RBC: 4.62 MIL/uL (ref 3.87–5.11)
RDW: 15.1 % (ref 11.5–15.5)
WBC: 3.1 10*3/uL — ABNORMAL LOW (ref 4.0–10.5)
nRBC: 0 % (ref 0.0–0.2)

## 2020-07-08 LAB — SARS CORONAVIRUS 2 BY RT PCR (HOSPITAL ORDER, PERFORMED IN ~~LOC~~ HOSPITAL LAB): SARS Coronavirus 2: POSITIVE — AB

## 2020-07-08 LAB — I-STAT BETA HCG BLOOD, ED (MC, WL, AP ONLY): I-stat hCG, quantitative: <5 m[IU]/mL (ref <5)

## 2020-07-08 LAB — LIPASE, BLOOD: Lipase: 20 U/L (ref 11–51)

## 2020-07-08 MED ORDER — SODIUM CHLORIDE 0.9 % IV BOLUS
500.0000 mL | Freq: Once | INTRAVENOUS | Status: AC
  Administered 2020-07-08: 14:00:00 500 mL via INTRAVENOUS

## 2020-07-08 MED ORDER — KETOROLAC TROMETHAMINE 30 MG/ML IJ SOLN
30.0000 mg | Freq: Once | INTRAMUSCULAR | Status: AC
  Administered 2020-07-08: 14:00:00 30 mg via INTRAVENOUS
  Filled 2020-07-08: qty 1

## 2020-07-08 MED ORDER — ONDANSETRON 4 MG PO TBDP: 4.0000 mg | ORAL_TABLET | Freq: Three times a day (TID) | ORAL | 0 refills | Status: AC | PRN

## 2020-07-08 MED ORDER — METOCLOPRAMIDE HCL 5 MG/ML IJ SOLN
10.0000 mg | Freq: Once | INTRAMUSCULAR | Status: AC
Start: 2020-07-08 — End: 2020-07-08
  Administered 2020-07-08: 14:00:00 10 mg via INTRAVENOUS
  Filled 2020-07-08: qty 2

## 2020-07-08 NOTE — ED Triage Notes (Signed)
Pt. Stated, I started on Monday with a headache and now Im having N/V and stomach pain.

## 2020-07-08 NOTE — Discharge Instructions (Signed)
You were seen in the emergency department for evaluation of fever chills body aches headache nausea vomiting.  You had blood work and a chest x-ray that did not show any serious findings.  Your Covid testing was pending at time of discharge and you can follow the results in MyChart.  Please drink plenty of fluids and can use Tylenol and ibuprofen for aches and pains.  We are prescribing you some nausea medication.  Please follow-up with your doctor and return to the emergency department for any worsening or concerning symptoms.

## 2020-07-08 NOTE — ED Notes (Signed)
Pt. Was upset, pt. Stated, I moved my arm the IV started to hurt. Pt was crying.  Removed the IV.

## 2020-07-08 NOTE — ED Provider Notes (Signed)
[facility] EMERGENCY DEPARTMENT Provider Note   CSN: [id number] Arrival date & time: 07/08/20  3810     History Chief Complaint  Patient presents with  . Headache  . Nausea  . Emesis  . Abdominal Pain    Pamela [name] is a 24 y.o. female.  She is complaining of 1 week of headache nausea vomiting fevers chills body aches cough nonproductive.  She has not been able to go to work because of her symptoms.  She has not had a Covid vaccine.  She has not recently tested for Covid.  She is also asking for pregnancy test.  The history is provided by the patient.  Influenza Presenting symptoms: cough, fever, headache, myalgias, nausea and vomiting   Presenting symptoms: no sore throat   Severity:  Moderate Onset quality:  Gradual Progression:  Unchanged Chronicity:  New Relieved by:  Nothing Worsened by:  Nothing Ineffective treatments:  None tried Associated symptoms: chills   Associated symptoms: no neck stiffness and no syncope   Risk factors: no immunocompromised state and not pregnant        Past Medical History:  Diagnosis Date  . Medical history non-contributory     Patient Active Problem List   Diagnosis Date Noted  . Anemia of mother in pregnancy, antepartum 01/05/2016  . Pica 12/14/2015  . Susceptible to varicella (non-immune), currently pregnant 10/31/2015    Past Surgical History:  Procedure Laterality Date  . NO PAST SURGERIES       OB History    Gravida  1   Para  1   Term  1   Preterm      AB      Living  2     SAB      TAB      Ectopic      Multiple  1   Live Births  2           Family History  Problem Relation Age of Onset  . Diabetes Mother   . Asthma Sister     Social History   Tobacco Use  . Smoking status: Current Every Day Smoker  . Smokeless tobacco: Never Used  Substance Use Topics  . Alcohol use: Yes  . Drug use: Yes    Types: Marijuana    Home Medications Prior to Admission  medications   Medication Sig Start Date End Date Taking? Authorizing Provider  ferrous sulfate (FERROUSUL) 325 (65 FE) MG tablet Take 1 tablet (325 mg total) by mouth 2 (two) times daily. 01/02/16   Wouk, Wilfred Curtis, MD  ibuprofen (ADVIL,MOTRIN) 600 MG tablet Take 1 tablet (600 mg total) by mouth every 6 (six) hours. 01/22/16   Montez Morita, CNM  naproxen (NAPROSYN) 500 MG tablet Take 1 tablet (500 mg total) by mouth 2 (two) times daily. 07/17/18   Bast, Gloris Manchester A, NP  ondansetron (ZOFRAN ODT) 4 MG disintegrating tablet Take 1 tablet (4 mg total) by mouth every 8 (eight) hours as needed for nausea or vomiting. 01/15/20   Wieters, Hallie C, PA-C  Prenatal Vit-Fe Fumarate-FA (PRENATAL VITAMIN PO) Take 1 tablet by mouth daily.     [provider]    Allergies    Patient has no known allergies.  Review of Systems   Review of Systems  Constitutional: Positive for chills and fever.  HENT: Negative for sore throat.   Eyes: Negative for visual disturbance.  Respiratory: Positive for cough.   Cardiovascular: Negative for chest  pain.  Gastrointestinal: Positive for nausea and vomiting.  Genitourinary: Negative for dysuria.  Musculoskeletal: Positive for myalgias. Negative for neck stiffness.  Skin: Negative for rash.  Neurological: Positive for headaches.    Physical Exam Updated Vital Signs BP 132/79 (BP Location: Left Arm)   Pulse 84   Temp 98.7 F (37.1 C) (Oral)   Resp 18   LMP 06/23/2020   SpO2 100%   Physical Exam Vitals and nursing note reviewed.  Constitutional:      General: She is not in acute distress.    Appearance: Normal appearance. She is well-developed. She is not ill-appearing.  HENT:     Head: Normocephalic and atraumatic.  Eyes:     Conjunctiva/sclera: Conjunctivae normal.  Cardiovascular:     Rate and Rhythm: Normal rate and regular rhythm.     Heart sounds: No murmur heard.   Pulmonary:     Effort: Pulmonary effort is normal. No respiratory distress.      Breath sounds: Normal breath sounds. No stridor. No wheezing.  Abdominal:     Palpations: Abdomen is soft.     Tenderness: There is no abdominal tenderness.  Musculoskeletal:        General: No tenderness. Normal range of motion.     Cervical back: Neck supple.  Skin:    General: Skin is warm and dry.     Capillary Refill: Capillary refill takes less than 2 seconds.  Neurological:     General: No focal deficit present.     Mental Status: She is alert.     GCS: GCS eye subscore is 4. GCS verbal subscore is 5. GCS motor subscore is 6.     ED Results / Procedures / Treatments   Labs (all labs ordered are listed, but only abnormal results are displayed) Labs Reviewed  SARS CORONAVIRUS 2 BY RT PCR (HOSPITAL ORDER, PERFORMED IN  HOSPITAL LAB) - Abnormal; Notable for the following components:      Result Value   SARS Coronavirus 2 POSITIVE (*)    All other components within normal limits  COMPREHENSIVE METABOLIC PANEL - Abnormal; Notable for the following components:   Glucose, Bld 121 (*)    All other components within normal limits  CBC - Abnormal; Notable for the following components:   WBC 3.1 (*)    Hemoglobin 11.8 (*)    MCH 25.5 (*)    All other components within normal limits  URINALYSIS, ROUTINE W REFLEX MICROSCOPIC - Abnormal; Notable for the following components:   APPearance HAZY (*)    Ketones, ur 20 (*)    Protein, ur 30 (*)    All other components within normal limits  LIPASE, BLOOD  I-STAT BETA HCG BLOOD, ED ([facility], [facility], [facility] ONLY)    EKG None  Radiology DG Chest Port 1 View  Result Date: 07/08/2020 CLINICAL DATA:  Headache and nausea.  Denies chest complaints. EXAM: PORTABLE CHEST 1 VIEW COMPARISON:  None. FINDINGS: The heart size and mediastinal contours are within normal limits. Both lungs are clear. The visualized skeletal structures are unremarkable. IMPRESSION: No active disease. Electronically Signed   By: Obie Dredge M.D.   On: 07/08/2020  15:26    Procedures Procedures (including critical care time)  Medications Ordered in ED Medications  sodium chloride 0.9 % bolus 500 mL (0 mLs Intravenous Stopped 07/08/20 1458)  ketorolac (TORADOL) 30 MG/ML injection 30 mg (30 mg Intravenous Given 07/08/20 1425)  metoCLOPramide (REGLAN) injection 10 mg (10 mg Intravenous Given 07/08/20 1425)  ED Course  I have reviewed the triage vital signs and the nursing notes.  Pertinent labs & imaging results that were available during my care of the patient were reviewed by me and considered in my medical decision making (see chart for details).  Clinical Course as of Jul 08 2252  Sat Jul 08, 2020  1451 Reassessed patient.  She is crying and complaining of pain where her IV was.  Her nurse that she received her medication and had about 400 of fluids and when the patient moved and might have dislodged her IV.  Does not appear infiltrated.  She is asking to be discharged.  States her headache is improved.   [MB]  1526 Chest x-ray interpreted by me as no acute infiltrates.   [MB]  1532 Patient is more comfortable now.  I reviewed her results with her.  She is comfortable going home and understands to follow-up with her Covid test results on MyChart.  Return instructions discussed.   [MB]    Clinical Course User Index [MB] Terrilee Files, MD   MDM Rules/Calculators/A&P                         This patient complains of headache body aches nausea vomiting abdominal pain; this involves an extensive number of treatment Options and is a complaint that carries with it a high risk of complications and Morbidity. The differential includes viral syndrome, Covid, infection, metabolic derangement  I ordered, reviewed and interpreted labs, which included CBC with low white count question viral process, none normal chemistries, urinalysis without obvious signs of infection, pregnancy negative I ordered medication IV fluids, Toradol and Reglan for  headache with improvement in her headache I ordered imaging studies which included chest x-ray and I independently    visualized and interpreted imaging which showed no gross infiltrates  Previous records obtained and reviewed in epic, no recent admissions  After the interventions stated above, I reevaluated the patient and found the patient to be symptomatically improved.  Her Covid testing was pending at time of discharge but was positive during time of dictation.  She was given instructions for follow-up with Covid clinic if Covid testing was positive and to follow-up with results in MyChart.  Return instructions discussed.   Final Clinical Impression(s) / ED Diagnoses Final diagnoses:  Person under investigation for COVID-19  Flu-like symptoms    Rx / DC Orders ED Discharge Orders         Ordered    ondansetron (ZOFRAN ODT) 4 MG disintegrating tablet  Every 8 hours PRN        07/08/20 1534           Terrilee Files, MD 07/08/20 2255

## 2021-08-19 IMAGING — DX DG CHEST 1V PORT
1 series · 1 of 1 positions shown · non-contrast
Comparison: None.

CLINICAL DATA: Headache and nausea.  Denies chest complaints.

EXAM:
PORTABLE CHEST 1 VIEW

[chest ap]
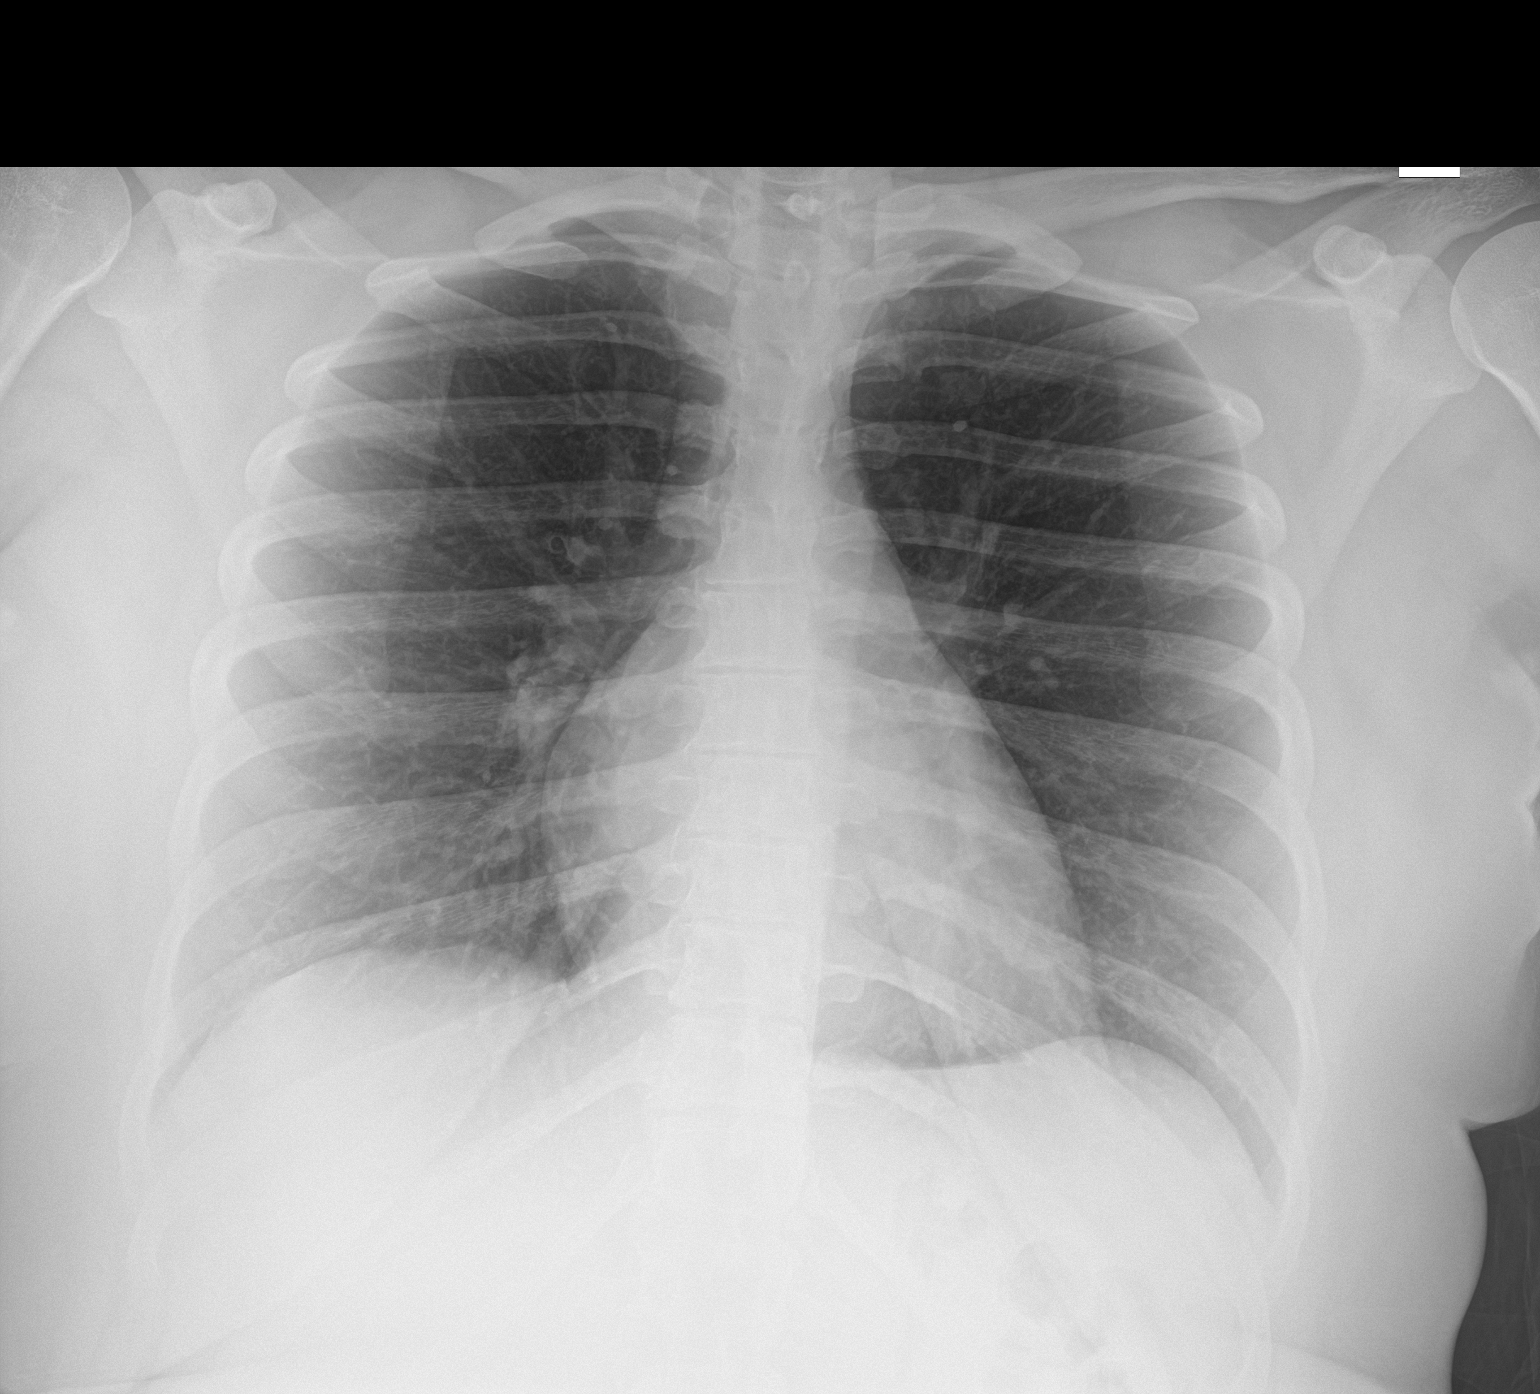

[1 of 1 positions shown; findings below may reference images not displayed]

FINDINGS: The heart size and mediastinal contours are within normal limits.
Both lungs are clear. The visualized skeletal structures are
unremarkable.
IMPRESSION: No active disease.

## 2022-03-14 ENCOUNTER — Encounter: Payer: Self-pay | Admitting: Obstetrics and Gynecology
